# Patient Record
Sex: Female | Born: 1935 | Race: White | Hispanic: No | Marital: Single | Smoking: Never smoker
Health system: Southern US, Community
[De-identification: ages and names within clinical notes are randomized; demographics above are authoritative.]

## PROBLEM LIST (undated history)

## (undated) DIAGNOSIS — H40039 Anatomical narrow angle, unspecified eye: Secondary | ICD-10-CM

## (undated) DIAGNOSIS — M069 Rheumatoid arthritis, unspecified: Secondary | ICD-10-CM

## (undated) DIAGNOSIS — I6350 Cerebral infarction due to unspecified occlusion or stenosis of unspecified cerebral artery: Secondary | ICD-10-CM

## (undated) DIAGNOSIS — B001 Herpesviral vesicular dermatitis: Secondary | ICD-10-CM

## (undated) DIAGNOSIS — R7302 Impaired glucose tolerance (oral): Secondary | ICD-10-CM

## (undated) DIAGNOSIS — I4891 Unspecified atrial fibrillation: Secondary | ICD-10-CM

## (undated) DIAGNOSIS — E049 Nontoxic goiter, unspecified: Secondary | ICD-10-CM

## (undated) DIAGNOSIS — M3500 Sicca syndrome, unspecified: Secondary | ICD-10-CM

## (undated) DIAGNOSIS — I10 Essential (primary) hypertension: Secondary | ICD-10-CM

## (undated) DIAGNOSIS — E039 Hypothyroidism, unspecified: Secondary | ICD-10-CM

## (undated) DIAGNOSIS — M6740 Ganglion, unspecified site: Secondary | ICD-10-CM

## (undated) DIAGNOSIS — H0260 Xanthelasma of unspecified eye, unspecified eyelid: Secondary | ICD-10-CM

## (undated) HISTORY — DX: Ganglion, unspecified site: M67.40

## (undated) HISTORY — DX: Herpesviral vesicular dermatitis: B00.1

## (undated) HISTORY — DX: Hypothyroidism, unspecified: E03.9

## (undated) HISTORY — DX: Rheumatoid arthritis, unspecified: M06.9

## (undated) HISTORY — DX: Xanthelasma of unspecified eye, unspecified eyelid: H02.60

## (undated) HISTORY — DX: Cerebral infarction due to unspecified occlusion or stenosis of unspecified cerebral artery: I63.50

## (undated) HISTORY — DX: Anatomical narrow angle, unspecified eye: H40.039

## (undated) HISTORY — DX: Sjogren syndrome, unspecified: M35.00

## (undated) HISTORY — DX: Essential (primary) hypertension: I10

## (undated) HISTORY — DX: Nontoxic goiter, unspecified: E04.9

## (undated) HISTORY — DX: Impaired glucose tolerance (oral): R73.02

## (undated) HISTORY — PX: APPENDECTOMY: SHX54

## (undated) HISTORY — DX: Unspecified atrial fibrillation: I48.91

---

## 1996-03-12 HISTORY — PX: OTHER SURGICAL HISTORY: SHX169

## 2002-10-29 ENCOUNTER — Emergency Department (HOSPITAL_COMMUNITY): Admission: EM | Admit: 2002-10-29 | Discharge: 2002-10-29 | Payer: Self-pay | Admitting: Emergency Medicine

## 2004-01-31 ENCOUNTER — Ambulatory Visit: Payer: Self-pay | Admitting: Internal Medicine

## 2004-02-28 ENCOUNTER — Ambulatory Visit: Payer: Self-pay | Admitting: Cardiovascular Disease

## 2004-03-28 ENCOUNTER — Ambulatory Visit: Payer: Self-pay | Admitting: Internal Medicine

## 2004-04-04 ENCOUNTER — Ambulatory Visit: Payer: Self-pay | Admitting: Cardiology

## 2004-04-24 ENCOUNTER — Ambulatory Visit: Payer: Self-pay | Admitting: Cardiology

## 2004-05-22 ENCOUNTER — Ambulatory Visit: Payer: Self-pay | Admitting: Cardiology

## 2004-06-19 ENCOUNTER — Ambulatory Visit: Payer: Self-pay | Admitting: Cardiology

## 2004-07-17 ENCOUNTER — Ambulatory Visit: Payer: Self-pay | Admitting: *Deleted

## 2004-08-15 ENCOUNTER — Ambulatory Visit: Payer: Self-pay | Admitting: *Deleted

## 2004-09-13 ENCOUNTER — Ambulatory Visit: Payer: Self-pay | Admitting: Internal Medicine

## 2004-09-27 ENCOUNTER — Ambulatory Visit: Payer: Self-pay | Admitting: Cardiology

## 2004-10-25 ENCOUNTER — Ambulatory Visit: Payer: Self-pay | Admitting: Cardiology

## 2004-11-03 ENCOUNTER — Ambulatory Visit: Payer: Self-pay | Admitting: Internal Medicine

## 2004-11-23 ENCOUNTER — Ambulatory Visit: Payer: Self-pay | Admitting: Internal Medicine

## 2004-12-07 ENCOUNTER — Ambulatory Visit: Payer: Self-pay | Admitting: Cardiology

## 2004-12-21 ENCOUNTER — Ambulatory Visit: Payer: Self-pay | Admitting: Cardiology

## 2005-01-05 ENCOUNTER — Ambulatory Visit: Payer: Self-pay | Admitting: Internal Medicine

## 2005-01-11 ENCOUNTER — Ambulatory Visit: Payer: Self-pay | Admitting: Cardiology

## 2005-02-08 ENCOUNTER — Ambulatory Visit: Payer: Self-pay | Admitting: Cardiology

## 2005-03-16 ENCOUNTER — Ambulatory Visit: Payer: Self-pay | Admitting: Cardiology

## 2005-04-06 ENCOUNTER — Ambulatory Visit: Payer: Self-pay | Admitting: Cardiology

## 2005-04-26 ENCOUNTER — Ambulatory Visit: Payer: Self-pay | Admitting: Cardiology

## 2005-05-24 ENCOUNTER — Ambulatory Visit: Payer: Self-pay | Admitting: Cardiology

## 2005-06-21 ENCOUNTER — Ambulatory Visit: Payer: Self-pay | Admitting: Cardiology

## 2005-07-19 ENCOUNTER — Ambulatory Visit: Payer: Self-pay | Admitting: Cardiology

## 2005-08-16 ENCOUNTER — Ambulatory Visit: Payer: Self-pay | Admitting: Internal Medicine

## 2005-09-25 ENCOUNTER — Ambulatory Visit: Payer: Self-pay | Admitting: Cardiology

## 2005-10-05 ENCOUNTER — Ambulatory Visit: Payer: Self-pay | Admitting: Cardiology

## 2005-10-25 ENCOUNTER — Ambulatory Visit: Payer: Self-pay | Admitting: Cardiology

## 2005-11-22 ENCOUNTER — Ambulatory Visit: Payer: Self-pay | Admitting: Cardiology

## 2005-12-20 ENCOUNTER — Ambulatory Visit: Payer: Self-pay | Admitting: Cardiology

## 2006-01-03 ENCOUNTER — Ambulatory Visit: Payer: Self-pay | Admitting: Internal Medicine

## 2006-01-17 ENCOUNTER — Ambulatory Visit: Payer: Self-pay | Admitting: Internal Medicine

## 2006-02-14 ENCOUNTER — Ambulatory Visit: Payer: Self-pay | Admitting: Cardiovascular Disease

## 2006-03-07 ENCOUNTER — Ambulatory Visit: Payer: Self-pay | Admitting: Cardiology

## 2006-03-18 ENCOUNTER — Ambulatory Visit: Payer: Self-pay | Admitting: Cardiology

## 2006-04-09 ENCOUNTER — Ambulatory Visit: Payer: Self-pay | Admitting: Cardiology

## 2006-05-07 ENCOUNTER — Ambulatory Visit: Payer: Self-pay | Admitting: Cardiology

## 2006-06-04 ENCOUNTER — Ambulatory Visit: Payer: Self-pay | Admitting: Cardiology

## 2006-07-02 ENCOUNTER — Ambulatory Visit: Payer: Self-pay | Admitting: Cardiology

## 2006-07-30 ENCOUNTER — Ambulatory Visit: Payer: Self-pay | Admitting: Cardiology

## 2006-08-27 ENCOUNTER — Ambulatory Visit: Payer: Self-pay | Admitting: Cardiology

## 2006-09-24 ENCOUNTER — Ambulatory Visit: Payer: Self-pay | Admitting: Cardiovascular Disease

## 2006-10-15 ENCOUNTER — Ambulatory Visit: Payer: Self-pay | Admitting: Cardiology

## 2006-11-06 ENCOUNTER — Ambulatory Visit: Payer: Self-pay | Admitting: Internal Medicine

## 2006-11-06 ENCOUNTER — Ambulatory Visit: Payer: Self-pay | Admitting: Cardiology

## 2006-11-06 LAB — CONVERTED CEMR LAB
BUN: 12 mg/dL (ref 6–23)
CO2: 33 meq/L — ABNORMAL HIGH (ref 19–32)
Calcium: 9 mg/dL (ref 8.4–10.5)
Chloride: 103 meq/L (ref 96–112)
Creatinine, Ser: 0.6 mg/dL (ref 0.4–1.2)
GFR calc Af Amer: 122 mL/min
GFR calc non Af Amer: 101 mL/min
Glucose, Bld: 173 mg/dL — ABNORMAL HIGH (ref 70–99)
Potassium: 4.8 meq/L (ref 3.5–5.1)
Sodium: 142 meq/L (ref 135–145)

## 2006-11-08 ENCOUNTER — Ambulatory Visit: Payer: Self-pay | Admitting: Internal Medicine

## 2006-11-11 ENCOUNTER — Encounter: Payer: Self-pay | Admitting: Internal Medicine

## 2006-11-12 ENCOUNTER — Ambulatory Visit: Payer: Self-pay | Admitting: Internal Medicine

## 2006-11-12 ENCOUNTER — Ambulatory Visit: Payer: Self-pay | Admitting: Cardiology

## 2006-11-12 LAB — CONVERTED CEMR LAB
Basophils Absolute: 0 10*3/uL (ref 0.0–0.1)
Basophils Relative: 0.1 % (ref 0.0–1.0)
Eosinophils Absolute: 0.2 10*3/uL (ref 0.0–0.6)
Eosinophils Relative: 1.8 % (ref 0.0–5.0)
HCT: 42.4 % (ref 36.0–46.0)
Hemoglobin: 14.4 g/dL (ref 12.0–15.0)
INR: 5.3 — ABNORMAL HIGH (ref 0.8–1.0)
Lymphocytes Relative: 12.8 % (ref 12.0–46.0)
MCHC: 33.9 g/dL (ref 30.0–36.0)
MCV: 92.9 fL (ref 78.0–100.0)
Monocytes Absolute: 0.4 10*3/uL (ref 0.2–0.7)
Monocytes Relative: 3.1 % (ref 3.0–11.0)
Neutro Abs: 10.7 10*3/uL — ABNORMAL HIGH (ref 1.4–7.7)
Neutrophils Relative %: 82.2 % — ABNORMAL HIGH (ref 43.0–77.0)
Platelets: 492 10*3/uL — ABNORMAL HIGH (ref 150–400)
Prothrombin Time: 29.9 s — ABNORMAL HIGH (ref 10.9–13.3)
RBC: 4.57 M/uL (ref 3.87–5.11)
RDW: 14.5 % (ref 11.5–14.6)
WBC: 13 10*3/uL — ABNORMAL HIGH (ref 4.5–10.5)

## 2006-11-14 ENCOUNTER — Ambulatory Visit: Payer: Self-pay | Admitting: Internal Medicine

## 2006-11-15 ENCOUNTER — Ambulatory Visit: Payer: Self-pay | Admitting: Cardiology

## 2006-11-26 ENCOUNTER — Ambulatory Visit: Payer: Self-pay | Admitting: Cardiology

## 2006-12-10 ENCOUNTER — Ambulatory Visit: Payer: Self-pay | Admitting: Cardiology

## 2006-12-26 ENCOUNTER — Ambulatory Visit: Payer: Self-pay | Admitting: Cardiology

## 2007-01-04 ENCOUNTER — Ambulatory Visit: Payer: Self-pay | Admitting: Internal Medicine

## 2007-01-09 ENCOUNTER — Ambulatory Visit: Payer: Self-pay | Admitting: Internal Medicine

## 2007-01-28 ENCOUNTER — Encounter: Payer: Self-pay | Admitting: Internal Medicine

## 2007-02-04 ENCOUNTER — Ambulatory Visit: Payer: Self-pay | Admitting: Internal Medicine

## 2007-03-04 ENCOUNTER — Ambulatory Visit: Payer: Self-pay | Admitting: Cardiology

## 2007-03-24 ENCOUNTER — Encounter: Payer: Self-pay | Admitting: Internal Medicine

## 2007-03-25 ENCOUNTER — Ambulatory Visit: Payer: Self-pay | Admitting: Cardiovascular Disease

## 2007-04-01 ENCOUNTER — Ambulatory Visit: Payer: Self-pay | Admitting: Cardiovascular Disease

## 2007-04-14 ENCOUNTER — Encounter: Payer: Self-pay | Admitting: *Deleted

## 2007-04-14 DIAGNOSIS — I4891 Unspecified atrial fibrillation: Secondary | ICD-10-CM

## 2007-04-14 DIAGNOSIS — I10 Essential (primary) hypertension: Secondary | ICD-10-CM | POA: Insufficient documentation

## 2007-04-14 DIAGNOSIS — M3500 Sicca syndrome, unspecified: Secondary | ICD-10-CM | POA: Insufficient documentation

## 2007-04-14 DIAGNOSIS — M810 Age-related osteoporosis without current pathological fracture: Secondary | ICD-10-CM | POA: Insufficient documentation

## 2007-04-14 DIAGNOSIS — E049 Nontoxic goiter, unspecified: Secondary | ICD-10-CM | POA: Insufficient documentation

## 2007-04-14 DIAGNOSIS — M069 Rheumatoid arthritis, unspecified: Secondary | ICD-10-CM

## 2007-04-14 DIAGNOSIS — E039 Hypothyroidism, unspecified: Secondary | ICD-10-CM

## 2007-04-14 DIAGNOSIS — Z9089 Acquired absence of other organs: Secondary | ICD-10-CM

## 2007-04-16 ENCOUNTER — Ambulatory Visit: Payer: Self-pay | Admitting: Internal Medicine

## 2007-04-16 DIAGNOSIS — H40039 Anatomical narrow angle, unspecified eye: Secondary | ICD-10-CM

## 2007-04-29 ENCOUNTER — Ambulatory Visit: Payer: Self-pay | Admitting: Cardiology

## 2007-05-23 ENCOUNTER — Ambulatory Visit: Payer: Self-pay | Admitting: Internal Medicine

## 2007-05-23 DIAGNOSIS — J069 Acute upper respiratory infection, unspecified: Secondary | ICD-10-CM | POA: Insufficient documentation

## 2007-05-27 ENCOUNTER — Ambulatory Visit: Payer: Self-pay | Admitting: Cardiology

## 2007-06-24 ENCOUNTER — Ambulatory Visit: Payer: Self-pay | Admitting: Cardiology

## 2007-06-26 ENCOUNTER — Telehealth: Payer: Self-pay | Admitting: Internal Medicine

## 2007-07-23 ENCOUNTER — Ambulatory Visit: Payer: Self-pay | Admitting: Cardiology

## 2007-08-20 ENCOUNTER — Ambulatory Visit: Payer: Self-pay | Admitting: Cardiology

## 2007-09-17 ENCOUNTER — Ambulatory Visit: Payer: Self-pay | Admitting: Internal Medicine

## 2007-10-08 ENCOUNTER — Ambulatory Visit: Payer: Self-pay | Admitting: Cardiology

## 2007-11-05 ENCOUNTER — Ambulatory Visit: Payer: Self-pay | Admitting: Cardiovascular Disease

## 2007-11-19 ENCOUNTER — Ambulatory Visit: Payer: Self-pay | Admitting: Internal Medicine

## 2007-12-03 ENCOUNTER — Ambulatory Visit: Payer: Self-pay | Admitting: Cardiology

## 2007-12-05 ENCOUNTER — Ambulatory Visit: Payer: Self-pay | Admitting: Internal Medicine

## 2007-12-05 DIAGNOSIS — H0260 Xanthelasma of unspecified eye, unspecified eyelid: Secondary | ICD-10-CM | POA: Insufficient documentation

## 2007-12-05 DIAGNOSIS — B009 Herpesviral infection, unspecified: Secondary | ICD-10-CM | POA: Insufficient documentation

## 2007-12-05 DIAGNOSIS — M6740 Ganglion, unspecified site: Secondary | ICD-10-CM | POA: Insufficient documentation

## 2007-12-22 ENCOUNTER — Encounter: Payer: Self-pay | Admitting: Internal Medicine

## 2008-01-06 ENCOUNTER — Ambulatory Visit: Payer: Self-pay | Admitting: Cardiovascular Disease

## 2008-02-09 ENCOUNTER — Ambulatory Visit: Payer: Self-pay | Admitting: Cardiovascular Disease

## 2008-03-11 ENCOUNTER — Ambulatory Visit: Payer: Self-pay | Admitting: Cardiology

## 2008-03-11 ENCOUNTER — Ambulatory Visit: Payer: Self-pay | Admitting: Internal Medicine

## 2008-04-08 ENCOUNTER — Ambulatory Visit: Payer: Self-pay | Admitting: Internal Medicine

## 2008-04-19 ENCOUNTER — Encounter: Payer: Self-pay | Admitting: Internal Medicine

## 2008-05-06 ENCOUNTER — Ambulatory Visit: Payer: Self-pay | Admitting: Internal Medicine

## 2008-05-20 ENCOUNTER — Ambulatory Visit: Payer: Self-pay | Admitting: Cardiovascular Disease

## 2008-06-10 ENCOUNTER — Ambulatory Visit: Payer: Self-pay | Admitting: Cardiovascular Disease

## 2008-06-17 ENCOUNTER — Encounter: Payer: Self-pay | Admitting: Internal Medicine

## 2008-07-06 ENCOUNTER — Ambulatory Visit: Payer: Self-pay | Admitting: Cardiology

## 2008-08-03 ENCOUNTER — Ambulatory Visit: Payer: Self-pay | Admitting: Cardiology

## 2008-08-10 ENCOUNTER — Encounter: Payer: Self-pay | Admitting: *Deleted

## 2008-08-11 ENCOUNTER — Telehealth (INDEPENDENT_AMBULATORY_CARE_PROVIDER_SITE_OTHER): Payer: Self-pay | Admitting: *Deleted

## 2008-08-11 ENCOUNTER — Encounter: Payer: Self-pay | Admitting: Cardiology

## 2008-08-26 ENCOUNTER — Encounter: Payer: Self-pay | Admitting: Internal Medicine

## 2008-08-31 ENCOUNTER — Ambulatory Visit: Payer: Self-pay | Admitting: Cardiology

## 2008-08-31 LAB — CONVERTED CEMR LAB
POC INR: 2.3
Prothrombin Time: 18.7 s

## 2008-09-15 ENCOUNTER — Encounter: Payer: Self-pay | Admitting: *Deleted

## 2008-09-28 ENCOUNTER — Ambulatory Visit: Payer: Self-pay | Admitting: Cardiology

## 2008-09-28 LAB — CONVERTED CEMR LAB
POC INR: 1.8
Prothrombin Time: 16.4 s

## 2008-10-19 ENCOUNTER — Ambulatory Visit: Payer: Self-pay | Admitting: Internal Medicine

## 2008-10-19 LAB — CONVERTED CEMR LAB
POC INR: 2.4
Prothrombin Time: 19 s

## 2008-10-21 ENCOUNTER — Encounter: Payer: Self-pay | Admitting: Cardiology

## 2008-11-16 ENCOUNTER — Ambulatory Visit: Payer: Self-pay | Admitting: Cardiology

## 2008-11-16 LAB — CONVERTED CEMR LAB: POC INR: 2.6

## 2008-12-14 ENCOUNTER — Ambulatory Visit: Payer: Self-pay | Admitting: Cardiology

## 2008-12-14 LAB — CONVERTED CEMR LAB: POC INR: 2

## 2008-12-31 ENCOUNTER — Ambulatory Visit: Payer: Self-pay | Admitting: Internal Medicine

## 2008-12-31 LAB — CONVERTED CEMR LAB
ALT: 16 units/L (ref 0–35)
AST: 21 units/L (ref 0–37)
Albumin: 3.6 g/dL (ref 3.5–5.2)
Alkaline Phosphatase: 60 units/L (ref 39–117)
BUN: 17 mg/dL (ref 6–23)
Basophils Absolute: 0.1 10*3/uL (ref 0.0–0.1)
Basophils Relative: 1.1 % (ref 0.0–3.0)
Bilirubin, Direct: 0.1 mg/dL (ref 0.0–0.3)
CO2: 31 meq/L (ref 19–32)
Calcium: 8.9 mg/dL (ref 8.4–10.5)
Chloride: 101 meq/L (ref 96–112)
Creatinine, Ser: 0.6 mg/dL (ref 0.4–1.2)
Eosinophils Absolute: 0.2 10*3/uL (ref 0.0–0.7)
Eosinophils Relative: 2.6 % (ref 0.0–5.0)
GFR calc non Af Amer: 100.09 mL/min (ref >60)
Glucose, Bld: 136 mg/dL — ABNORMAL HIGH (ref 70–99)
HCT: 43.3 % (ref 36.0–46.0)
Hemoglobin: 14.6 g/dL (ref 12.0–15.0)
Lymphocytes Relative: 14.5 % (ref 12.0–46.0)
Lymphs Abs: 1.4 10*3/uL (ref 0.7–4.0)
MCHC: 33.8 g/dL (ref 30.0–36.0)
MCV: 96.4 fL (ref 78.0–100.0)
Monocytes Absolute: 0.8 10*3/uL (ref 0.1–1.0)
Monocytes Relative: 8.5 % (ref 3.0–12.0)
Neutro Abs: 6.9 10*3/uL (ref 1.4–7.7)
Neutrophils Relative %: 73.3 % (ref 43.0–77.0)
Platelets: 286 10*3/uL (ref 150.0–400.0)
Potassium: 4.2 meq/L (ref 3.5–5.1)
RBC: 4.49 M/uL (ref 3.87–5.11)
RDW: 15.8 % — ABNORMAL HIGH (ref 11.5–14.6)
Sodium: 139 meq/L (ref 135–145)
TSH: 11.79 microintl units/mL — ABNORMAL HIGH (ref 0.35–5.50)
Total Bilirubin: 0.7 mg/dL (ref 0.3–1.2)
Total Protein: 7.4 g/dL (ref 6.0–8.3)
WBC: 9.4 10*3/uL (ref 4.5–10.5)

## 2009-01-06 ENCOUNTER — Encounter: Payer: Self-pay | Admitting: Internal Medicine

## 2009-01-11 ENCOUNTER — Ambulatory Visit: Payer: Self-pay | Admitting: Internal Medicine

## 2009-01-11 LAB — CONVERTED CEMR LAB: POC INR: 2.3

## 2009-02-08 ENCOUNTER — Ambulatory Visit: Payer: Self-pay | Admitting: Cardiology

## 2009-02-08 LAB — CONVERTED CEMR LAB: POC INR: 2

## 2009-03-01 ENCOUNTER — Telehealth: Payer: Self-pay | Admitting: Internal Medicine

## 2009-03-08 ENCOUNTER — Ambulatory Visit: Payer: Self-pay | Admitting: Cardiology

## 2009-03-08 LAB — CONVERTED CEMR LAB: POC INR: 2.3

## 2009-03-15 ENCOUNTER — Ambulatory Visit: Payer: Self-pay | Admitting: Cardiology

## 2009-03-23 ENCOUNTER — Encounter: Payer: Self-pay | Admitting: Internal Medicine

## 2009-04-05 ENCOUNTER — Ambulatory Visit: Payer: Self-pay | Admitting: Cardiovascular Disease

## 2009-04-05 LAB — CONVERTED CEMR LAB: POC INR: 2.3

## 2009-05-03 ENCOUNTER — Ambulatory Visit: Payer: Self-pay | Admitting: Cardiology

## 2009-05-03 LAB — CONVERTED CEMR LAB: POC INR: 1.7

## 2009-05-18 ENCOUNTER — Ambulatory Visit: Payer: Self-pay | Admitting: Cardiovascular Disease

## 2009-05-18 LAB — CONVERTED CEMR LAB: POC INR: 2.2

## 2009-05-19 ENCOUNTER — Encounter: Payer: Self-pay | Admitting: Internal Medicine

## 2009-06-15 ENCOUNTER — Ambulatory Visit: Payer: Self-pay | Admitting: Cardiology

## 2009-06-15 LAB — CONVERTED CEMR LAB: POC INR: 2.3

## 2009-07-13 ENCOUNTER — Ambulatory Visit: Payer: Self-pay | Admitting: Cardiology

## 2009-07-13 LAB — CONVERTED CEMR LAB: POC INR: 2.4

## 2009-08-03 ENCOUNTER — Encounter: Payer: Self-pay | Admitting: Internal Medicine

## 2009-08-10 ENCOUNTER — Ambulatory Visit: Payer: Self-pay | Admitting: Cardiology

## 2009-08-10 LAB — CONVERTED CEMR LAB: POC INR: 2.5

## 2009-09-08 ENCOUNTER — Ambulatory Visit: Payer: Self-pay | Admitting: Cardiology

## 2009-09-08 LAB — CONVERTED CEMR LAB: POC INR: 2.7

## 2009-09-15 ENCOUNTER — Encounter: Payer: Self-pay | Admitting: Internal Medicine

## 2009-10-06 ENCOUNTER — Ambulatory Visit: Payer: Self-pay | Admitting: Internal Medicine

## 2009-10-06 LAB — CONVERTED CEMR LAB: POC INR: 2.4

## 2009-11-03 ENCOUNTER — Ambulatory Visit: Payer: Self-pay | Admitting: Internal Medicine

## 2009-11-03 LAB — CONVERTED CEMR LAB: POC INR: 3.3

## 2009-11-09 ENCOUNTER — Encounter: Payer: Self-pay | Admitting: Cardiology

## 2009-11-15 ENCOUNTER — Ambulatory Visit: Payer: Self-pay | Admitting: Cardiovascular Disease

## 2009-11-24 ENCOUNTER — Ambulatory Visit: Payer: Self-pay | Admitting: Cardiology

## 2009-11-24 LAB — CONVERTED CEMR LAB: POC INR: 2.2

## 2009-11-29 ENCOUNTER — Ambulatory Visit: Payer: Self-pay | Admitting: Internal Medicine

## 2009-12-15 ENCOUNTER — Ambulatory Visit: Payer: Self-pay | Admitting: Cardiology

## 2009-12-15 ENCOUNTER — Inpatient Hospital Stay (HOSPITAL_COMMUNITY): Admission: EM | Admit: 2009-12-15 | Discharge: 2009-12-16 | Payer: Self-pay | Admitting: Emergency Medicine

## 2009-12-15 ENCOUNTER — Ambulatory Visit: Payer: Self-pay | Admitting: Internal Medicine

## 2009-12-16 ENCOUNTER — Encounter: Payer: Self-pay | Admitting: Internal Medicine

## 2009-12-19 ENCOUNTER — Telehealth: Payer: Self-pay | Admitting: Internal Medicine

## 2009-12-21 ENCOUNTER — Encounter: Payer: Self-pay | Admitting: Cardiology

## 2009-12-21 LAB — CONVERTED CEMR LAB
POC INR: 2.37
Prothrombin Time: 26 s

## 2009-12-28 ENCOUNTER — Telehealth: Payer: Self-pay | Admitting: Internal Medicine

## 2009-12-29 ENCOUNTER — Ambulatory Visit: Payer: Self-pay | Admitting: Internal Medicine

## 2009-12-29 DIAGNOSIS — I6350 Cerebral infarction due to unspecified occlusion or stenosis of unspecified cerebral artery: Secondary | ICD-10-CM | POA: Insufficient documentation

## 2010-01-04 ENCOUNTER — Encounter: Payer: Self-pay | Admitting: Internal Medicine

## 2010-01-05 ENCOUNTER — Ambulatory Visit: Payer: Self-pay | Admitting: Internal Medicine

## 2010-01-05 ENCOUNTER — Telehealth: Payer: Self-pay | Admitting: Internal Medicine

## 2010-01-05 LAB — CONVERTED CEMR LAB: POC INR: 2.5

## 2010-01-07 ENCOUNTER — Encounter: Payer: Self-pay | Admitting: Internal Medicine

## 2010-01-11 ENCOUNTER — Encounter: Payer: Self-pay | Admitting: Internal Medicine

## 2010-01-16 ENCOUNTER — Telehealth: Payer: Self-pay | Admitting: Internal Medicine

## 2010-01-27 ENCOUNTER — Ambulatory Visit: Payer: Self-pay | Admitting: Internal Medicine

## 2010-01-27 LAB — CONVERTED CEMR LAB: TSH: 2.11 microintl units/mL (ref 0.35–5.50)

## 2010-01-31 ENCOUNTER — Telehealth: Payer: Self-pay | Admitting: Internal Medicine

## 2010-02-04 ENCOUNTER — Telehealth: Payer: Self-pay | Admitting: Internal Medicine

## 2010-02-06 ENCOUNTER — Ambulatory Visit: Payer: Self-pay | Admitting: Cardiology

## 2010-02-06 LAB — CONVERTED CEMR LAB: POC INR: 2.6

## 2010-02-13 ENCOUNTER — Telehealth: Payer: Self-pay | Admitting: Internal Medicine

## 2010-03-09 ENCOUNTER — Ambulatory Visit: Payer: Self-pay | Admitting: Cardiology

## 2010-03-09 LAB — CONVERTED CEMR LAB: POC INR: 2.7

## 2010-03-16 ENCOUNTER — Ambulatory Visit: Admission: RE | Admit: 2010-03-16 | Discharge: 2010-03-16 | Payer: Self-pay | Source: Home / Self Care

## 2010-03-17 ENCOUNTER — Encounter: Payer: Self-pay | Admitting: Cardiology

## 2010-03-28 ENCOUNTER — Telehealth: Payer: Self-pay | Admitting: Internal Medicine

## 2010-03-29 ENCOUNTER — Ambulatory Visit
Admission: RE | Admit: 2010-03-29 | Discharge: 2010-03-29 | Payer: Self-pay | Source: Home / Self Care | Attending: Internal Medicine | Admitting: Internal Medicine

## 2010-03-30 ENCOUNTER — Ambulatory Visit
Admission: RE | Admit: 2010-03-30 | Discharge: 2010-03-30 | Payer: Self-pay | Source: Home / Self Care | Attending: Internal Medicine | Admitting: Internal Medicine

## 2010-03-31 ENCOUNTER — Ambulatory Visit
Admission: RE | Admit: 2010-03-31 | Discharge: 2010-03-31 | Payer: Self-pay | Source: Home / Self Care | Attending: Internal Medicine | Admitting: Internal Medicine

## 2010-04-04 ENCOUNTER — Ambulatory Visit
Admission: RE | Admit: 2010-04-04 | Discharge: 2010-04-04 | Payer: Self-pay | Source: Home / Self Care | Attending: Internal Medicine | Admitting: Internal Medicine

## 2010-04-06 ENCOUNTER — Ambulatory Visit: Admission: RE | Admit: 2010-04-06 | Discharge: 2010-04-06 | Payer: Self-pay | Source: Home / Self Care

## 2010-04-06 LAB — CONVERTED CEMR LAB: POC INR: 3.4

## 2010-04-11 NOTE — Miscellaneous (Signed)
Summary: Living Will  Living Will   Imported By: Sherian Rein 01/04/2010 08:21:18  _____________________________________________________________________  External Attachment:    Type:   Image     Comment:   External Document

## 2010-04-11 NOTE — Medication Information (Signed)
Summary: rov/jk  Anticoagulant Therapy  Managed by: Bethena Midget, RN, BSN Referring MD: Rollene Rotunda MD PCP: Jacques Navy MD Supervising MD: Eden Emms MD, Theron Arista Indication 1: Atrial Fibrillation (ICD-427.31) Lab Used: [facility] Site: [facility] INR POC 1.2 INR RANGE 2 - 3  Dietary changes: no    Health status changes: no    Bleeding/hemorrhagic complications: no    Recent/future hospitalizations: no    Any changes in medication regimen? yes       Details: Holding Methotrexate for extraction.   Recent/future dental: yes     Details: Extraction on 11/16/09  Any missed doses?: yes     Details: Took last dose on Friday for extraction per Dr Bradly Bienenstock  Is patient compliant with meds? yes       Allergies: 1)  ! Plaquenil (Hydroxychloroquine Sulfate)  Anticoagulation Management History:      The patient is taking warfarin and comes in today for a routine follow up visit.  Positive risk factors for bleeding include an age of 75 years or older.  The bleeding index is 'intermediate risk'.  Positive CHADS2 values include History of HTN and Age > 55 years old.  The start date was 09/22/2002.  Her last INR was 5.3 RATIO.  Anticoagulation responsible Alban Marucci: Eden Emms MD, Theron Arista.  INR POC: 1.2.  Cuvette Lot#: [id number].  Exp: 12/11/2010.    Anticoagulation Management Assessment/Plan:      The patient's current anticoagulation dose is Jantoven 5 mg  tabs: as directed.  The target INR is 2.0-3.0.  The next INR is due 11/24/2009.  Anticoagulation instructions were given to patient.  Results were reviewed/authorized by Bethena Midget, RN, BSN.  She was notified by Bethena Midget, RN, BSN.         Prior Anticoagulation Instructions: INR 3.3  Skip today, August 25th's dose of Coumadin.  Then resume normal schedule of taking 1/2 tablet (2.5mg ) every day except take 1 tablet (5mg ) on Tuesdays.  Recheck in 3 weeks.   Current Anticoagulation Instructions: INR 1.2 Resume per Dr Bradly Bienenstock  instructions. Restart with 5mg s for 2 days then resume 2.5mg s everyday except 5mg s on Tuesdays. Recheck in one week.

## 2010-04-11 NOTE — Medication Information (Signed)
Summary: [name] [name]  Anticoagulant Therapy  Managed by: Bethena Midget, RN, BSN Referring MD: Rollene Rotunda MD PCP: Jacques Navy MD Supervising MD: Shirlee Latch MD, Dalton Indication 1: Atrial Fibrillation (ICD-427.31) Lab Used: [facility] Site: [facility] INR POC 2.2 INR RANGE 2 - 3  Dietary changes: no    Health status changes: no    Bleeding/hemorrhagic complications: no    Recent/future hospitalizations: no    Any changes in medication regimen? yes       Details: Took Amoxicillin post extractions for 5 days  Recent/future dental: no  Any missed doses?: no       Is patient compliant with meds? yes       Allergies: 1)  ! Plaquenil (Hydroxychloroquine Sulfate)  Anticoagulation Management History:      The patient is taking warfarin and comes in today for a routine follow up visit.  Positive risk factors for bleeding include an age of 75 years or older.  The bleeding index is 'intermediate risk'.  Positive CHADS2 values include History of HTN and Age > 71 years old.  The start date was 09/22/2002.  Her last INR was 5.3 RATIO.  Anticoagulation responsible Pauline Trainer: Shirlee Latch MD, Dalton.  INR POC: 2.2.  Cuvette Lot#: [id number].  Exp: 12/11/2010.    Anticoagulation Management Assessment/Plan:      The patient's current anticoagulation dose is Jantoven 5 mg  tabs: as directed.  The target INR is 2.0-3.0.  The next INR is due 12/22/2009.  Anticoagulation instructions were given to patient.  Results were reviewed/authorized by Bethena Midget, RN, BSN.  She was notified by Bethena Midget, RN, BSN.         Prior Anticoagulation Instructions: INR 1.2 Resume per Dr Bradly Bienenstock instructions. Restart with 5mg s for 2 days then resume 2.5mg s everyday except 5mg s on Tuesdays. Recheck in one week.  Current Anticoagulation Instructions: INR 2.2 Continue 2.5mg s everyday except 5mg s on Tuesdays. Recheck in 4 weeks

## 2010-04-11 NOTE — Medication Information (Signed)
Summary: rov/tm  Anticoagulant Therapy  Managed by: Bethena Midget, RN, BSN Referring MD: Rollene Rotunda MD PCP: Jacques Navy MD Supervising MD: Eden Emms MD, Theron Arista Indication 1: Atrial Fibrillation (ICD-427.31) Lab Used: [facility] Site: [facility] INR POC 2.2 INR RANGE 2 - 3  Dietary changes: no    Health status changes: no    Bleeding/hemorrhagic complications: no    Recent/future hospitalizations: no    Any changes in medication regimen? no    Recent/future dental: no  Any missed doses?: no       Is patient compliant with meds? yes       Allergies: 1)  ! Plaquenil (Hydroxychloroquine Sulfate)  Anticoagulation Management History:      The patient is taking warfarin and comes in today for a routine follow up visit.  Positive risk factors for bleeding include an age of 75 years or older.  The bleeding index is 'intermediate risk'.  Positive CHADS2 values include History of HTN and Age > 61 years old.  The start date was 09/22/2002.  Her last INR was 5.3 RATIO.  Anticoagulation responsible provider: Eden Emms MD, Theron Arista.  INR POC: 2.2.  Cuvette Lot#: [id number].  Exp: 07/2010.    Anticoagulation Management Assessment/Plan:      The patient's current anticoagulation dose is Jantoven 5 mg  tabs: as directed.  The target INR is 2.0-3.0.  The next INR is due 06/15/2009.  Anticoagulation instructions were given to patient.  Results were reviewed/authorized by Bethena Midget, RN, BSN.  She was notified by Bethena Midget, RN, BSN.         Prior Anticoagulation Instructions: INR 1.7 Today take 7.5mg s then resume 2.5mg s everyday except 5mg s on Tuesdays. Recheck in 2 weeks.   Current Anticoagulation Instructions: INR 2.2 Continue 2.5mg s daily except 5mg s on Tuesdays. Recheck in 4 weeks.

## 2010-04-11 NOTE — Letter (Signed)
Summary: [facility]   Imported By: Sherian Rein 05/31/2009 14:59:08  _____________________________________________________________________  External Attachment:    Type:   Image     Comment:   External Document

## 2010-04-11 NOTE — Letter (Signed)
Summary: Technical sales engineer  [facility]   Imported By: Lennie Odor 09/27/2009 14:11:27  _____________________________________________________________________  External Attachment:    Type:   Image     Comment:   External Document

## 2010-04-11 NOTE — Medication Information (Signed)
Summary: [facility]  Anticoagulant Therapy  Managed by: Bethena Midget, RN, BSN Referring MD: Rollene Rotunda MD PCP: Jacques Navy MD Supervising MD: Daleen Squibb MD, Maisie Fus Indication 1: Atrial Fibrillation (ICD-427.31) Lab Used: [facility] Site: [facility] PT 26.0 INR POC 2.37 INR RANGE 2 - 3  Dietary changes: no    Health status changes: no    Bleeding/hemorrhagic complications: no    Recent/future hospitalizations: yes       Details: Last Thursday AM didn't feel right, handwriting off, speech slurred, went to ER  Any changes in medication regimen? no    Recent/future dental: no  Any missed doses?: no       Is patient compliant with meds? yes      Comments: Lab from 12/16/09 at D/C from [facility]. Daughter (Ms Audie Pinto) called states pt went to ER and admitted for one day due to Dx of CVA. Wanted to know since INR was done there if we could CX appt for tomorrow and schedule out.  Instructed to continue same regimen and follow up in 2 weeks due to recent CVA.   Allergies: 1)  ! Plaquenil (Hydroxychloroquine Sulfate)  Anticoagulation Management History:      Her anticoagulation is being managed by telephone today.  Positive risk factors for bleeding include an age of 75 years or older.  The bleeding index is 'intermediate risk'.  Positive CHADS2 values include History of HTN and Age > 39 years old.  The start date was 09/22/2002.  Her last INR was 5.3 RATIO.  Prothrombin time is 26.0.  Anticoagulation responsible provider: Daleen Squibb MD, Maisie Fus.  INR POC: 2.37.    Anticoagulation Management Assessment/Plan:      The patient's current anticoagulation dose is Jantoven 5 mg  tabs: as directed.  The target INR is 2.0-3.0.  The next INR is due 01/05/2010.  Anticoagulation instructions were given to daughter.  Results were reviewed/authorized by Bethena Midget, RN, BSN.  She was notified by Bethena Midget, RN, BSN.         Prior Anticoagulation Instructions: INR 2.2 Continue 2.5mg s everyday except  5mg s on Tuesdays. Recheck in 4 weeks   Current Anticoagulation Instructions: INR 2.37 Continue 2.5mg s daily except 5mg s on Tuesdays. Recheck in 2 weeks.

## 2010-04-11 NOTE — Progress Notes (Signed)
Summary: REQ FOR ORDERS  Phone Note Call from Patient Call back at Home Phone [phone]   Caller: Patient Call For: Jacques Navy MD Summary of Call: [name] [name]/physical therapist needs verbal order to continue her pt through next week, [phone] Initial call taken by: Verdell Face,  December 28, 2009 11:12 AM  Follow-up for Phone Call        ok to continue PT Follow-up by: Jacques Navy MD,  December 28, 2009 11:14 AM  Additional Follow-up for Phone Call Additional follow up Details #1::        Therapist informed  Additional Follow-up by: Lamar Sprinkles, CMA,  December 28, 2009 11:53 AM

## 2010-04-11 NOTE — Medication Information (Signed)
Summary: rov/sp  Anticoagulant Therapy  Managed by: Weston Brass, PharmD Referring MD: Rollene Rotunda MD PCP: Jacques Navy MD Supervising MD: Riley Kill MD, Maisie Fus Indication 1: Atrial Fibrillation (ICD-427.31) Indication 2: CVA Lab Used: [facility] Site: [facility] INR POC 2.6 INR RANGE 2 - 3  Dietary changes: no    Health status changes: no    Bleeding/hemorrhagic complications: no    Recent/future hospitalizations: no    Any changes in medication regimen? yes       Details: Prednisone increased to 5 mg on 10/26; Synthroid was decresed to 75 mcg about 1 mo ago, 11/10 started on Amoxicillin x10d   Recent/future dental: yes     Details: 11/09 has a broken, was started on Amoxicillin 11/10 x10d  Any missed doses?: no       Is patient compliant with meds? yes       Current Medications (verified): 1)  Calcium Carbonate-Vitamin D 600-125 Mg-Unit  Tabs (Calcium Carbonate-Vitamin D) .... Two Daily 2)  Folic Acid 1 Mg  Tabs (Folic Acid) .... Once Daily 3)  Jantoven 5 Mg  Tabs (Warfarin Sodium) .... As Directed 4)  Levothroid 75 Mcg Tabs (Levothyroxine Sodium) .Marland Kitchen.. 1 Tablet Once A Day 5)  Diltiazem Hcl Cr 240 Mg  Cp24 (Diltiazem Hcl) .... Once Daily 6)  Digoxin 0.125 Mg  Tabs (Digoxin) .... One Half Tablet Daily 7)  Methotrexate 2.5 Mg  Tabs (Methotrexate Sodium) .... 6 Tablets Weekly 8)  Prednisone 5 Mg Tabs (Prednisone) .... Take 1 Tablet By Mouth Daily  Allergies: 1)  ! Plaquenil (Hydroxychloroquine Sulfate)  Anticoagulation Management History:      The patient is taking warfarin and comes in today for a routine follow up visit.  Positive risk factors for bleeding include an age of 4 years or older and history of CVA/TIA.  The bleeding index is 'intermediate risk'.  Positive CHADS2 values include History of HTN, Age > 66 years old, and Prior Stroke/CVA/TIA.  The start date was 09/22/2002.  Her last INR was 5.3 RATIO.  Anticoagulation responsible Luisfelipe Engelstad: Riley Kill MD,  Maisie Fus.  INR POC: 2.6.  Cuvette Lot#: [id number].  Exp: 02/2011.    Anticoagulation Management Assessment/Plan:      The patient's current anticoagulation dose is Jantoven 5 mg  tabs: as directed.  The target INR is 2.0-3.0.  The next INR is due 03/09/2010.  Anticoagulation instructions were given to daughter.  Results were reviewed/authorized by Weston Brass, PharmD.  She was notified by Hoy Register, PharmD Candidate.         Prior Anticoagulation Instructions: INR 2.5  Continue same dose of 1/2 tablet every day except 1 tablet on Tuesday.  Recheck INR in 4 weeks.   Current Anticoagulation Instructions: INR 2.6 Continue previous dose of 0.5 tablet everyday except 1 tablet on Tuesday Recheck INR in 4 weeks

## 2010-04-11 NOTE — Progress Notes (Signed)
Summary: warfarin  Phone Note Refill Request   Refills Requested: Medication #1:  JANTOVEN 5 MG  TABS as directed   Last Refilled: 01/03/2010 Received fax request for Warfarin Sodium 5 mg tab  Initial call taken by: Alysia Penna,  January 16, 2010 3:34 PM    Prescriptions: JANTOVEN 5 MG  TABS (WARFARIN SODIUM) as directed  #30 Tablet x 2   Entered by:   Alysia Penna   Authorized by:   Jacques Navy MD   Signed by:   Alysia Penna on 01/16/2010   Method used:   Electronically to        [facility] [phone]* (retail)       [address]       [place], [place]  [address]       Ph: [phone] or [phone]       Fax: [phone]   RxID:   [id number]

## 2010-04-11 NOTE — Medication Information (Signed)
Summary: rov/ewj  Anticoagulant Therapy  Managed by: Charolotte Eke, PharmD Referring MD: Rollene Rotunda MD PCP: Jacques Navy MD Supervising MD: Gala Romney MD, Reuel Boom Indication 1: Atrial Fibrillation (ICD-427.31) Lab Used: [facility] Site: [facility] INR POC 2.4 INR RANGE 2 - 3  Dietary changes: no    Health status changes: no    Bleeding/hemorrhagic complications: no    Recent/future hospitalizations: no    Any changes in medication regimen? no    Recent/future dental: no  Any missed doses?: no       Is patient compliant with meds? yes       Current Medications (verified): 1)  Calcium Carbonate-Vitamin D 600-125 Mg-Unit  Tabs (Calcium Carbonate-Vitamin D) .... Two Daily 2)  Folic Acid 1 Mg  Tabs (Folic Acid) .... Once Daily 3)  Alendronate Sodium 70 Mg  Tabs (Alendronate Sodium) .Marland Kitchen.. 1 Q Wk 4)  Jantoven 5 Mg  Tabs (Warfarin Sodium) .... As Directed 5)  Levothyroxine Sodium 125 Mcg Tabs (Levothyroxine Sodium) .Marland Kitchen.. 1 Once Daily 6)  Diltiazem Hcl Cr 240 Mg  Cp24 (Diltiazem Hcl) .... Once Daily 7)  Digoxin 0.125 Mg  Tabs (Digoxin) .... One Half Tablet Daily 8)  Methotrexate 2.5 Mg  Tabs (Methotrexate Sodium) .... 6 Tablets Weekly 9)  Prednisone 1 Mg Tabs (Prednisone) .... 3 Tabs By Mouth Daily  Allergies (verified): 1)  ! Plaquenil (Hydroxychloroquine Sulfate)  Anticoagulation Management History:      The patient is taking warfarin and comes in today for a routine follow up visit.  Positive risk factors for bleeding include an age of 75 years or older.  The bleeding index is 'intermediate risk'.  Positive CHADS2 values include History of HTN and Age > 15 years old.  The start date was 09/22/2002.  Her last INR was 5.3 RATIO.  Anticoagulation responsible provider: Bensimhon MD, Reuel Boom.  INR POC: 2.4.  Cuvette Lot#: [id number].  Exp: 12/11/2010.    Anticoagulation Management Assessment/Plan:      The patient's current anticoagulation dose is Jantoven 5 mg  tabs: as  directed.  The target INR is 2.0-3.0.  The next INR is due 11/03/2009.  Anticoagulation instructions were given to patient.  Results were reviewed/authorized by Charolotte Eke, PharmD.  She was notified by Charolotte Eke, PharmD.         Prior Anticoagulation Instructions: INR 2.7  Continue on same dosage 2.5mg  daily except 5mg  on Tuesdays.  Recheck in 4 weeks.    Current Anticoagulation Instructions: Continue same: 2.5mg  daily except 5mg  on Tuesdays.

## 2010-04-11 NOTE — Progress Notes (Signed)
  Phone Note Outgoing Call   Reason for Call: Discuss lab or test results Summary of Call: HEY!!!!!  Please call patient - TSH is 2.11 and normal. Continue present meds.  Thanks Initial call taken by: Jacques Navy MD,  February 04, 2010 5:47 PM  Follow-up for Phone Call        informed pt's daughter Follow-up by: Ami Bullins CMA,  February 06, 2010 4:37 PM

## 2010-04-11 NOTE — Medication Information (Signed)
Summary: rov/tm  Anticoagulant Therapy  Managed by: Cloyde Reams, RN, BSN Referring MD: Rollene Rotunda MD PCP: Jacques Navy MD Supervising MD: Shirlee Latch MD, Dalton Indication 1: Atrial Fibrillation (ICD-427.31) Lab Used: [facility] Site: [facility] INR POC 2.7 INR RANGE 2 - 3  Dietary changes: no    Health status changes: no    Bleeding/hemorrhagic complications: no    Recent/future hospitalizations: no    Any changes in medication regimen? yes       Details: Started on Prednisone 3mg  daily and Methotrexate 6 tablets weekly. Started on 08/05/09.   Recent/future dental: no  Any missed doses?: no       Is patient compliant with meds? yes       Allergies: 1)  ! Plaquenil (Hydroxychloroquine Sulfate)  Anticoagulation Management History:      The patient is taking warfarin and comes in today for a routine follow up visit.  Positive risk factors for bleeding include an age of 15 years or older.  The bleeding index is 'intermediate risk'.  Positive CHADS2 values include History of HTN and Age > 3 years old.  The start date was 09/22/2002.  Her last INR was 5.3 RATIO.  Anticoagulation responsible provider: Shirlee Latch MD, Dalton.  INR POC: 2.7.  Cuvette Lot#: [id number].  Exp: 11/2010.    Anticoagulation Management Assessment/Plan:      The patient's current anticoagulation dose is Jantoven 5 mg  tabs: as directed.  The target INR is 2.0-3.0.  The next INR is due 10/06/2009.  Anticoagulation instructions were given to patient.  Results were reviewed/authorized by Cloyde Reams, RN, BSN.  She was notified by Cloyde Reams RN.         Prior Anticoagulation Instructions: INR 2.5 Continue 2.5mg s daily except 5mg s on Tuesdays. Recheck in 4 weeks. Ok per Weston Brass, [place] D  Current Anticoagulation Instructions: INR 2.7  Continue on same dosage 2.5mg  daily except 5mg  on Tuesdays.  Recheck in 4 weeks.

## 2010-04-11 NOTE — Medication Information (Signed)
Summary: rov-tp  Anticoagulant Therapy  Managed by: Weston Brass, PharmD Referring MD: Rollene Rotunda MD PCP: Jacques Navy MD Supervising MD: Graciela Husbands MD, Viviann Spare Indication 1: Atrial Fibrillation (ICD-427.31) Lab Used: [facility] Site: [facility] INR POC 3.3 INR RANGE 2 - 3  Dietary changes: no    Health status changes: no    Bleeding/hemorrhagic complications: no    Recent/future hospitalizations: no    Any changes in medication regimen? no    Recent/future dental: yes     Details: Pt is having a tooth extraction in the near future with Dr. Corinna Gab [phone]).  Pending.    Any missed doses?: no       Is patient compliant with meds? yes       Allergies: 1)  ! Plaquenil (Hydroxychloroquine Sulfate)  Anticoagulation Management History:      The patient is taking warfarin and comes in today for a routine follow up visit.  Positive risk factors for bleeding include an age of 20 years or older.  The bleeding index is 'intermediate risk'.  Positive CHADS2 values include History of HTN and Age > 37 years old.  The start date was 09/22/2002.  Her last INR was 5.3 RATIO.  Anticoagulation responsible provider: Graciela Husbands MD, Viviann Spare.  INR POC: 3.3.  Cuvette Lot#: [id number].  Exp: 12/11/2010.    Anticoagulation Management Assessment/Plan:      The patient's current anticoagulation dose is Jantoven 5 mg  tabs: as directed.  The target INR is 2.0-3.0.  The next INR is due 11/24/2009.  Anticoagulation instructions were given to patient.  Results were reviewed/authorized by Weston Brass, PharmD.  She was notified by Gweneth Fritter, PharmD Candidate.         Prior Anticoagulation Instructions: Continue same: 2.5mg  daily except 5mg  on Tuesdays.  Current Anticoagulation Instructions: INR 3.3  Skip today, August 25th's dose of Coumadin.  Then resume normal schedule of taking 1/2 tablet (2.5mg ) every day except take 1 tablet (5mg ) on Tuesdays.  Recheck in 3 weeks.

## 2010-04-11 NOTE — Letter (Signed)
Summary: [facility]   Imported By: Sherian Rein 09/07/2009 13:52:06  _____________________________________________________________________  External Attachment:    Type:   Image     Comment:   External Document

## 2010-04-11 NOTE — Assessment & Plan Note (Signed)
Summary: post hospital/per sarah/ok time/cd   Vital Signs:  Patient profile:   77 year old female Height:      61 inches Weight:      130 pounds BMI:     24.65 O2 Sat:      96 % on Room air Temp:     97.3 degrees F oral Pulse rate:   76 / minute BP sitting:   152 / 68  (left arm) Cuff size:   regular  Vitals Entered By: Bill Salinas CMA (December 29, 2009 4:45 PM)  O2 Flow:  Room air CC: hosp follow up/ ab   Primary Care Provider:  Jacques Navy MD  CC:  hosp follow up/ ab.  History of Present Illness: Patient is seen post-hospital. She was admitted for symptoms of a CVA that were transient. MRI did reveal a pontine infarct left. She has done very well since being home with full recovery of her ability to write and her speech. She does have some gait abnormality and has been working with PT at home. She is using a walker and will be going to a cane. Her hospital records were reviewed with her and her daughter, including a review of the MRI images and report.   Current Medications (verified): 1)  Calcium Carbonate-Vitamin D 600-125 Mg-Unit  Tabs (Calcium Carbonate-Vitamin D) .... Two Daily 2)  Folic Acid 1 Mg  Tabs (Folic Acid) .... Once Daily 3)  Jantoven 5 Mg  Tabs (Warfarin Sodium) .... As Directed 4)  Levothyroxine Sodium 125 Mcg Tabs (Levothyroxine Sodium) .Marland Kitchen.. 1 Once Daily 5)  Diltiazem Hcl Cr 240 Mg  Cp24 (Diltiazem Hcl) .... Once Daily 6)  Digoxin 0.125 Mg  Tabs (Digoxin) .... One Half Tablet Daily 7)  Methotrexate 2.5 Mg  Tabs (Methotrexate Sodium) .... 6 Tablets Weekly 8)  Prednisone 1 Mg Tabs (Prednisone) .... 3 Tabs By Mouth Daily  Allergies (verified): 1)  ! 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 * VAGINAL CYST REMOVED 1998 APPENDECTOMY, HX OF (ICD-V45.79)    Family History: Reviewed history from 12/05/2007 and no changes required. non-contributory  Social History: Reviewed history from 11/29/2009 and no changes required. HSG, married '43-29 years, widowed. 1 daughter - '56-adopted; 1 grandson lives alone - I ADLs End- of - Life : still has packet at home, she has not made a decision (rediscussed 9/11)  Review of Systems       The patient complains of difficulty walking.  The patient denies anorexia, fever, weight loss, weight gain, decreased hearing, hoarseness, chest pain, syncope, dyspnea on exertion, prolonged cough, abdominal pain, severe indigestion/heartburn, muscle weakness, suspicious skin lesions, depression, unusual weight change, and enlarged lymph nodes.    Physical Exam  General:  WNWD white woman who looks great for her age Head:  Normocephalic and atraumatic without obvious abnormalities. No apparent alopecia or balding. Eyes:  vision grossly intact, pupils equal, pupils round, and corneas and lenses clear.   Neck:  supple and full ROM.   Lungs:  normal respiratory effort and normal breath sounds.   Heart:  normal rate, regular rhythm, no murmur, and no gallop.   Abdomen:  soft, non-tender, and normal bowel sounds.  Msk:  normal ROM, no joint tenderness, no joint swelling, and no redness over joints.   Pulses:  2+ radial Neurologic:  alert & oriented X3, cranial nerves II-XII intact, and strength normal in all extremities.  Gait favors the right leg. Skin:  turgor normal, color normal, and no suspicious lesions.   Psych:  Oriented X3, memory intact for recent and remote, normally interactive, and good eye contact.     Impression &  Recommendations:  Problem # 1:  CVA (ICD-434.91) Making a very good recovery. She is on coumadin and was at the time of her stroke.   Plan - finish home PT           continue coumadin.  Her updated medication list for this problem includes:    Jantoven 5 Mg Tabs (Warfarin sodium) .Marland Kitchen... As directed  Problem # 2:  HYPERTENSION (ICD-401.9)  Her updated medication list for this problem includes:    Diltiazem Hcl Cr 240 Mg Cp24 (Diltiazem hcl) ..... Once daily  BP today: 152/68 Prior BP: 120/56 (11/29/2009)  BP elevated at today's visit.  Plan - monitor BP at home. If she remains hypertensive will need to adjust medications.      Complete Medication List: 1)  Calcium Carbonate-vitamin D 600-125 Mg-unit Tabs (Calcium carbonate-vitamin d) .... Two daily 2)  Folic Acid 1 Mg Tabs (Folic acid) .... Once daily 3)  Jantoven 5 Mg Tabs (Warfarin sodium) .... As directed 4)  Levothyroxine Sodium 125 Mcg Tabs (Levothyroxine sodium) .Marland Kitchen.. 1 once daily 5)  Diltiazem Hcl Cr 240 Mg Cp24 (Diltiazem hcl) .... Once daily 6)  Digoxin 0.125 Mg Tabs (Digoxin) .... One half tablet daily 7)  Methotrexate 2.5 Mg Tabs (Methotrexate sodium) .... 6 tablets weekly 8)  Prednisone 1 Mg Tabs (Prednisone) .... 3 tabs by mouth daily   Orders Added: 1)  Est. Patient Level IV [29562]

## 2010-04-11 NOTE — Assessment & Plan Note (Signed)
Summary: 1 yr rov f/u 427.31   pfh   Visit Type:  Follow-up Primary Provider:  Jacques Navy MD  CC:  Atrial Fib.  History of Present Illness: The patient presents for yearly followup. Since I last saw her she has had no new cardiovascular problems. Rarely notices palpitations. She's had some mild problems with balance but no dizziness presyncope or syncope. She has had no falls. She still lives independently. She has no shortness of breath and denies any PND or orthopnea. She has no chest pressure, neck or arm discomfort. She is bothered by arthritis and is planning to start Humira.  Current Medications (verified): 1)  Calcium Carbonate-Vitamin D 600-125 Mg-Unit  Tabs (Calcium Carbonate-Vitamin D) .... Two Daily 2)  Folic Acid 1 Mg  Tabs (Folic Acid) .... Once Daily 3)  Alendronate Sodium 70 Mg  Tabs (Alendronate Sodium) .Marland Kitchen.. 1 Q Wk 4)  Jantoven 5 Mg  Tabs (Warfarin Sodium) .... As Directed 5)  Levothyroxine Sodium 125 Mcg Tabs (Levothyroxine Sodium) .Marland Kitchen.. 1 Once Daily 6)  Diltiazem Hcl Cr 240 Mg  Cp24 (Diltiazem Hcl) .... Once Daily 7)  Digoxin 0.125 Mg  Tabs (Digoxin) .... One Half Tablet Daily 8)  Methotrexate 2.5 Mg  Tabs (Methotrexate Sodium) .... 8 Tablets Weekly 9)  Prednisone 5 Mg Tabs (Prednisone) .Marland Kitchen.. 1 Tab By Mouth Once Daily  Allergies: 1)  ! Plaquenil (Hydroxychloroquine Sulfate)  Past History:  Past Medical History: Reviewed history from 04/14/2007 and no changes required. HYPOTHYROIDISM (ICD-244.9) HYPERTENSION (ICD-401.9) ATRIAL FIBRILLATION (ICD-427.31) SJOGREN'S SYNDROME (ICD-710.2) GOITER (ICD-240.9) ARTHRITIS, RHEUMATOID (ICD-714.0) OSTEOPOROSIS (ICD-733.00)  Past Surgical History: Reviewed history from 04/14/2007 and no changes required. * VAGINAL CYST REMOVED 1998 APPENDECTOMY, HX OF (ICD-V45.79)    Review of Systems       As stated in the HPI and negative for all other systems.   Vital Signs:  Patient profile:   77 year old female Height:       61 inches Weight:      132 pounds Pulse rate:   72 / minute Resp:     12 per minute BP sitting:   160 / 70  (left arm)  Vitals Entered By: Kem Parkinson (March 15, 2009 2:24 PM)  Physical Exam  General:  Well developed, well nourished, in no acute distress. Head:  normocephalic and atraumatic Eyes:  PERRLA/EOM intact; conjunctiva and lids normal. Mouth:  Teeth, gums and palate normal. Oral mucosa normal. Neck:  Neck supple, no JVD. No masses, thyromegaly or abnormal cervical nodes. Chest Wall:  no deformities or breast masses noted Lungs:  Clear bilaterally to auscultation and percussion. Abdomen:  Bowel sounds positive; abdomen soft and non-tender without masses, organomegaly, or hernias noted. No hepatosplenomegaly. Msk:  Back normal, normal gait. Muscle strength and tone normal. Extremities:  No clubbing or cyanosis. Neurologic:  Alert and oriented x 3. Skin:  Intact without lesions or rashes. Psych:  Normal affect.   Detailed Cardiovascular Exam  Neck    Carotids: Carotids full and equal bilaterally without bruits.      Neck Veins: Normal, no JVD.    Heart    Inspection: no deformities or lifts noted.      Palpation: normal PMI with no thrills palpable.      Auscultation: S1 and S2 within normal limits, no S3, no clicks, no rubs, no murmurs, irregular rhythm.    Vascular    Abdominal Aorta: no palpable masses, pulsations, or audible bruits.      Femoral Pulses:  normal femoral pulses bilaterally.      Pedal Pulses: normal pedal pulses bilaterally.      Radial Pulses: normal radial pulses bilaterally.      Peripheral Circulation: no clubbing, cyanosis, or edema noted with normal capillary refill.     EKG  Procedure date:  03/15/2009  Findings:      atrial fibrillation, rate 72, left axis deviation, poor anterior R-wave progression, no acute ST-T wave changes, nonspecific ST depression  Impression & Recommendations:  Problem # 1:  ATRIAL FIBRILLATION  (ICD-427.31) The patient has had no problems associated with this. We did review the possible drug interaction between Humira and Coumadin. This will be followed in our Coumadin clinic. She would otherwise have no contraindication to this. Orders: EKG w/ Interpretation (93000)  Problem # 2:  HYPERTENSION (ICD-401.9) Her blood pressure is slightly elevated today. However, she's had a busy stressful day. I reviewed the blood pressures from Dr. Debby Bud visits and these have been typically below 140/90. Therefore, I will make no change her medicines at this time.  Patient Instructions: 1)  Your physician recommends that you schedule a follow-up appointment in: 1 year with Dr Antoine Poche 2)  Your physician recommends that you continue on your current medications as directed. Please refer to the Current Medication list given to you today. 3)  You have been diagnosed with atrial fibrillation.  Atrial fibrillation is a condition in which one of the upper chambers of the heart has extra electrical cells causing it to beat very fast.  Please see the handout/brochure given to you today for further information.

## 2010-04-11 NOTE — Letter (Signed)
Summary: Prosth[facility]ics  Prosthodontics   Imported By: Marylou Mccoy 11/10/2009 10:19:51  _____________________________________________________________________  External Attachment:    Type:   Image     Comment:   External Document  Appended Document: Prosthodontics OK to stop coumadin.
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
## 2010-04-11 NOTE — Progress Notes (Signed)
  Phone Note Other Incoming   Caller: Spero Curb Summary of Call: Pt has been off coumadin for 2 days and her daughter is calling to see if it is ok for her to be off the coumadin for another 4 days, she is having a route canal. Her daughter said she has had this done before and been ok'd to be taken off (doing fine) coumadin by [facility]. Please Advise. Pt's daughter had called [facility] as well Initial call taken by: Ami Bullins CMA,  February 13, 2010 4:44 PM  Follow-up for Phone Call        OK to hold coumadin for up-coming root canal Follow-up by: Jacques Navy MD,  February 13, 2010 5:30 PM  Additional Follow-up for Phone Call Additional follow up Details #1::        when should pt start coumdin after procedure and she states pt is being put on amoxicillin 500mg  FYI Additional Follow-up by: Ami Bullins CMA,  February 14, 2010 10:33 AM    Additional Follow-up for Phone Call Additional follow up Details #2::    when dentist thinks her bleeding risk is over. Follow-up by: Jacques Navy MD,  February 14, 2010 5:44 PM  Additional Follow-up for Phone Call Additional follow up Details #3:: Details for Additional Follow-up Action Taken: informed pt's daughter rebecca  Additional Follow-up by: Ami Bullins CMA,  February 15, 2010 11:51 AM

## 2010-04-11 NOTE — Letter (Signed)
Summary: [facility]   Imported By: Sherian Rein 01/17/2010 10:49:24  _____________________________________________________________________  External Attachment:    Type:   Image     Comment:   External Document

## 2010-04-11 NOTE — Miscellaneous (Signed)
Summary: Health Care POA  Health Care POA   Imported By: Sherian Rein 01/04/2010 08:22:32  _____________________________________________________________________  External Attachment:    Type:   Image     Comment:   External Document

## 2010-04-11 NOTE — Medication Information (Signed)
Summary: rov/tm  Anticoagulant Therapy  Managed by: Weston Brass, PharmD Referring MD: Rollene Rotunda MD PCP: Jacques Navy MD Supervising MD: Gala Romney MD, Reuel Boom Indication 1: Atrial Fibrillation (ICD-427.31) Indication 2: CVA Lab Used: [facility]  Site: [facility] INR POC 2.5 INR RANGE 2 - 3  Dietary changes: no    Health status changes: no    Bleeding/hemorrhagic complications: no    Recent/future hospitalizations: yes       Details: went to hospital earlier this month with CVA.  INR was therapeutic.   Any changes in medication regimen? yes       Details: increased prednisone from 3mg  daily to 5mg  daily  Recent/future dental: no  Any missed doses?: no       Is patient compliant with meds? yes       Allergies: 1)  ! Plaquenil (Hydroxychloroquine Sulfate)  Anticoagulation Management History:      The patient is taking warfarin and comes in today for a routine follow up visit.  Positive risk factors for bleeding include an age of 75 years or older and history of CVA/TIA.  The bleeding index is 'intermediate risk'.  Positive CHADS2 values include History of HTN, Age > 22 years old, and Prior Stroke/CVA/TIA.  The start date was 09/22/2002.  Her last INR was 5.3 RATIO.  Anticoagulation responsible provider: Bensimhon MD, Reuel Boom.  INR POC: 2.5.  Cuvette Lot#: [id number].  Exp: 02/2011.    Anticoagulation Management Assessment/Plan:      The patient's current anticoagulation dose is Jantoven 5 mg  tabs: as directed.  The target INR is 2.0-3.0.  The next INR is due 02/06/2010.  Anticoagulation instructions were given to daughter.  Results were reviewed/authorized by Weston Brass, PharmD.  She was notified by Weston Brass PharmD.         Prior Anticoagulation Instructions: INR 2.37 Continue 2.5mg s daily except 5mg s on Tuesdays. Recheck in 2 weeks.   Current Anticoagulation Instructions: INR 2.5  Continue same dose of 1/2 tablet every day except 1 tablet on Tuesday.  Recheck INR  in 4 weeks.

## 2010-04-11 NOTE — Letter (Signed)
Summary: [facility]   Imported By: Lester Boykin 04/05/2009 15:57:55  _____________________________________________________________________  External Attachment:    Type:   Image     Comment:   External Document

## 2010-04-11 NOTE — Miscellaneous (Signed)
Summary: Order/[facility]  Order/[facility]   Imported By: Lester [place] 01/10/2010 09:37:37  _____________________________________________________________________  External Attachment:    Type:   Image     Comment:   External Document

## 2010-04-11 NOTE — Miscellaneous (Signed)
Summary: Care Plans/[facility]  Care Plans/[facility]   Imported By: Sherian Rein 01/12/2010 11:54:08  _____________________________________________________________________  External Attachment:    Type:   Image     Comment:   External Document

## 2010-04-11 NOTE — Miscellaneous (Signed)
Summary: PT orders/[facility]  PT orders/[facility]   Imported By: Sherian Rein 01/05/2010 13:09:46  _____________________________________________________________________  External Attachment:    Type:   Image     Comment:   External Document

## 2010-04-11 NOTE — Medication Information (Signed)
Summary: ROV/TM  Anticoagulant Therapy  Managed by: Elaina Pattee, PharmD Referring MD: Rollene Rotunda MD PCP: Jacques Navy MD Supervising MD: Riley Kill MD, Maisie Fus Indication 1: Atrial Fibrillation (ICD-427.31) Lab Used: [facility] Site: [facility] INR POC 2.3 INR RANGE 2 - 3  Dietary changes: no    Health status changes: no    Bleeding/hemorrhagic complications: no    Recent/future hospitalizations: no    Any changes in medication regimen? yes       Details: Tapering prednisone every 2 weeks. Please ask patient which dose she is on at next visit.  Recent/future dental: no  Any missed doses?: no       Is patient compliant with meds? yes       Allergies: 1)  ! Plaquenil (Hydroxychloroquine Sulfate)  Anticoagulation Management History:      The patient is taking warfarin and comes in today for a routine follow up visit.  Positive risk factors for bleeding include an age of 17 years or older.  The bleeding index is 'intermediate risk'.  Positive CHADS2 values include History of HTN and Age > 59 years old.  The start date was 09/22/2002.  Her last INR was 5.3 RATIO.  Anticoagulation responsible provider: Riley Kill MD, Maisie Fus.  INR POC: 2.3.  Cuvette Lot#: [id number].  Exp: 07/2010.    Anticoagulation Management Assessment/Plan:      The patient's current anticoagulation dose is Jantoven 5 mg  tabs: as directed.  The target INR is 2.0-3.0.  The next INR is due 07/13/2009.  Anticoagulation instructions were given to patient.  Results were reviewed/authorized by Elaina Pattee, PharmD.  She was notified by Elaina Pattee, PharmD.         Prior Anticoagulation Instructions: INR 2.2 Continue 2.5mg s daily except 5mg s on Tuesdays. Recheck in 4 weeks.   Current Anticoagulation Instructions: INR 2.3. Take 0.5 tablet daily except 1 tablet on Tuesdays.

## 2010-04-11 NOTE — Progress Notes (Signed)
Summary: RESULTS  Phone Note Call from Patient   Details for Reason: Call daughter on cell [phone] Summary of Call: Pt had recent TSH labs to f/u from new dose. Please advise so I can call daughter, should pt stay on same dose? Initial call taken by: Lamar Sprinkles, CMA,  January 31, 2010 9:01 AM  Follow-up for Phone Call        TSH 2.11 - perfect. Continue present dose.  Follow-up by: Jacques Navy MD,  January 31, 2010 6:00 PM  Additional Follow-up for Phone Call Additional follow up Details #1::        informed pts daughter rebecca  Additional Follow-up by: Ami Bullins CMA,  February 01, 2010 8:33 AM

## 2010-04-11 NOTE — Progress Notes (Signed)
Summary: Home Health   Phone Note From Other Clinic   Summary of Call: Physical therapist called req verbal ok for social worker consult to help family with insurance issues. I gave verbal ok.  Initial call taken by: Lamar Sprinkles, CMA,  December 19, 2009 5:07 PM  Follow-up for Phone Call        k Follow-up by: Jacques Navy MD,  December 20, 2009 12:41 PM

## 2010-04-11 NOTE — Progress Notes (Signed)
Summary: TSH  Phone Note Other Incoming Call back at Home Phone [phone]   Caller: Lurena Joiner- [phone] pt daughter  Summary of Call: Pt's daughter called and said she received a call from Dr Jeanine Luz nurse Racheal. She said they told her that her mother thyroid was low at .40. I have called racheal to clarify this, I left a message on her voicemail to call me back to clarify.  Just an FYI Initial call taken by: Ami Bullins CMA,  January 05, 2010 3:46 PM  Follow-up for Phone Call        Daugther called again, please advise. Result of TSH is 0.055 Follow-up by: Lamar Sprinkles, CMA,  January 05, 2010 5:13 PM  Additional Follow-up for Phone Call Additional follow up Details #1::        reduce levothyroxine dose to .075* with follow-up lab in 3 weeks.  244.9  * new Rx: levothyroxine #30 refill as needed, sig 1 by mouth once daily  Additional Follow-up by: Jacques Navy MD,  January 06, 2010 9:52 AM    Additional Follow-up for Phone Call Additional follow up Details #2::    informed pt and put labs in IDX Follow-up by: Ami Bullins CMA,  January 06, 2010 3:52 PM  New/Updated Medications: LEVOTHROID 75 MCG TABS (LEVOTHYROXINE SODIUM) 1 tablet once a day Prescriptions: LEVOTHROID 75 MCG TABS (LEVOTHYROXINE SODIUM) 1 tablet once a day  #30 x 2   Entered by:   Ami Bullins CMA   Authorized by:   Jacques Navy MD   Signed by:   Bill Salinas CMA on 01/06/2010   Method used:   Electronically to        [facility] [address] [phone]* (retail)       [address]       [place], [place]  [address]       Ph: [phone] or [phone]       Fax: [phone]   RxID:   [id number]

## 2010-04-11 NOTE — Medication Information (Signed)
Summary: ROV/TM  Anticoagulant Therapy  Managed by: Shelby Dubin, PharmD, BCPS, CPP Referring MD: Rollene Rotunda MD PCP: Jacques Navy MD Supervising MD: Excell Seltzer MD, Casimiro Needle Indication 1: Atrial Fibrillation (ICD-427.31) Lab Used: [facility] [place] [facility] Site: [facility] INR POC 2.3 INR RANGE 2 - 3  Dietary changes: no    Health status changes: no    Bleeding/hemorrhagic complications: no    Recent/future hospitalizations: no    Any changes in medication regimen? yes       Details: Started Humira injection 2 weeks ago  Recent/future dental: no  Any missed doses?: no       Is patient compliant with meds? yes       Allergies (verified): 1)  ! Plaquenil (Hydroxychloroquine Sulfate)  Anticoagulation Management History:      The patient is taking warfarin and comes in today for a routine follow up visit.  Positive risk factors for bleeding include an age of 75 years or older.  The bleeding index is 'intermediate risk'.  Positive CHADS2 values include History of HTN and Age > 75 years old.  The start date was 09/22/2002.  Her last INR was 5.3 RATIO.  Anticoagulation responsible provider: Excell Seltzer MD, Casimiro Needle.  INR POC: 2.3.  Cuvette Lot#: [id number].  Exp: 06/2010.    Anticoagulation Management Assessment/Plan:      The patient's current anticoagulation dose is Jantoven 5 mg  tabs: as directed.  The target INR is 2.0-3.0.  The next INR is due 05/03/2009.  Anticoagulation instructions were given to patient.  Results were reviewed/authorized by Shelby Dubin, PharmD, BCPS, CPP.  She was notified by Ysidro Evert, Pharm D Candidate.         Prior Anticoagulation Instructions: INR 2.3 Continue 2.5mg s everyday except 5mg s on Tuesdays. Recheck in 4 weeks.   Current Anticoagulation Instructions: INR: 2.3 Continue with same dosage of 1/2 tablet daily except 1 tablet on Tuesdays Recheck in 4 weeks

## 2010-04-11 NOTE — Medication Information (Signed)
Summary: rov/tm  Anticoagulant Therapy  Managed by: Bethena Midget, RN, BSN Referring MD: Rollene Rotunda MD PCP: Jacques Navy MD Supervising MD: Riley Kill MD, Maisie Fus Indication 1: Atrial Fibrillation (ICD-427.31) Lab Used: [facility] Site: [facility] INR POC 2.5 INR RANGE 2 - 3  Dietary changes: no    Health status changes: no    Bleeding/hemorrhagic complications: no    Recent/future hospitalizations: no    Any changes in medication regimen? yes       Details: Humira was discontined after 07/27/09 injection. Prednisone 3mg s daily started on 08/05/09   Recent/future dental: no  Any missed doses?: no       Is patient compliant with meds? yes      Comments: Has history of being on Predisone 3mg  daily in the past  Current Medications (verified): 1)  Calcium Carbonate-Vitamin D 600-125 Mg-Unit  Tabs (Calcium Carbonate-Vitamin D) .... Two Daily 2)  Folic Acid 1 Mg  Tabs (Folic Acid) .... Once Daily 3)  Alendronate Sodium 70 Mg  Tabs (Alendronate Sodium) .Marland Kitchen.. 1 Q Wk 4)  Jantoven 5 Mg  Tabs (Warfarin Sodium) .... As Directed 5)  Levothyroxine Sodium 125 Mcg Tabs (Levothyroxine Sodium) .Marland Kitchen.. 1 Once Daily 6)  Diltiazem Hcl Cr 240 Mg  Cp24 (Diltiazem Hcl) .... Once Daily 7)  Digoxin 0.125 Mg  Tabs (Digoxin) .... One Half Tablet Daily 8)  Methotrexate 2.5 Mg  Tabs (Methotrexate Sodium) .... 6 Tablets Weekly 9)  Prednisone 1 Mg Tabs (Prednisone) .... 3 Tabs By Mouth Daily  Allergies (verified): 1)  ! Plaquenil (Hydroxychloroquine Sulfate)  Anticoagulation Management History:      The patient is taking warfarin and comes in today for a routine follow up visit.  Positive risk factors for bleeding include an age of 48 years or older.  The bleeding index is 'intermediate risk'.  Positive CHADS2 values include History of HTN and Age > 75 years old.  The start date was 09/22/2002.  Her last INR was 5.3 RATIO.  Anticoagulation responsible provider: Riley Kill MD, Maisie Fus.  INR POC: 2.5.   Cuvette Lot#: [id number].  Exp: 10/2010.    Anticoagulation Management Assessment/Plan:      The patient's current anticoagulation dose is Jantoven 5 mg  tabs: as directed.  The target INR is 2.0-3.0.  The next INR is due 09/08/2009.  Anticoagulation instructions were given to patient.  Results were reviewed/authorized by Bethena Midget, RN, BSN.  She was notified by Bethena Midget, RN, BSN.         Prior Anticoagulation Instructions: INR 2.4 Continue 2.5mg  daily except 5mg  on Tuesdays. Recheck in 4 weeks.   Current Anticoagulation Instructions: INR 2.5 Continue 2.5mg s daily except 5mg s on Tuesdays. Recheck in 4 weeks. Ok per Weston Brass, [place] D

## 2010-04-11 NOTE — Medication Information (Signed)
Summary: rov/ez  Anticoagulant Therapy  Managed by: Bethena Midget, RN, BSN Referring MD: Rollene Rotunda MD PCP: Jacques Navy MD Supervising MD: Daleen Squibb MD, Maisie Fus Indication 1: Atrial Fibrillation (ICD-427.31) Lab Used: [facility] Site: [facility] INR POC 1.7 INR RANGE 2 - 3  Dietary changes: no    Health status changes: no    Bleeding/hemorrhagic complications: no    Recent/future hospitalizations: no    Any changes in medication regimen? no    Recent/future dental: no  Any missed doses?: no       Is patient compliant with meds? yes      Comments: Humira i injection B-weekly. This week is the week for the injeciton. Receives it tomorrow  Allergies: 1)  ! Plaquenil (Hydroxychloroquine Sulfate)  Anticoagulation Management History:      The patient is taking warfarin and comes in today for a routine follow up visit.  Positive risk factors for bleeding include an age of 28 years or older.  The bleeding index is 'intermediate risk'.  Positive CHADS2 values include History of HTN and Age > 12 years old.  The start date was 09/22/2002.  Her last INR was 5.3 RATIO.  Anticoagulation responsible provider: Daleen Squibb MD, Maisie Fus.  INR POC: 1.7.  Cuvette Lot#: [id number].  Exp: 06/2010.    Anticoagulation Management Assessment/Plan:      The patient's current anticoagulation dose is Jantoven 5 mg  tabs: as directed.  The target INR is 2.0-3.0.  The next INR is due 05/18/2009.  Anticoagulation instructions were given to patient.  Results were reviewed/authorized by Bethena Midget, RN, BSN.  She was notified by Bethena Midget, RN, BSN.         Prior Anticoagulation Instructions: INR: 2.3 Continue with same dosage of 1/2 tablet daily except 1 tablet on Tuesdays Recheck in 4 weeks  Current Anticoagulation Instructions: INR 1.7 Today take 7.5mg s then resume 2.5mg s everyday except 5mg s on Tuesdays. Recheck in 2 weeks.

## 2010-04-11 NOTE — Medication Information (Signed)
Summary: rov/cb  Anticoagulant Therapy  Managed by: Bethena Midget, RN, BSN Referring MD: Rollene Rotunda MD PCP: Jacques Navy MD Supervising MD: Jens Som MD, Arlys John Indication 1: Atrial Fibrillation (ICD-427.31) Lab Used: [facility] [place] Site: [facility] INR POC 2.4 INR RANGE 2 - 3  Dietary changes: no    Health status changes: no    Bleeding/hemorrhagic complications: no    Recent/future hospitalizations: no    Any changes in medication regimen? yes       Details: Presently on 1mg  daily of Prednisone. Due to complete dose this week.   Recent/future dental: no  Any missed doses?: no       Is patient compliant with meds? yes       Allergies: 1)  ! Plaquenil (Hydroxychloroquine Sulfate)  Anticoagulation Management History:      The patient is taking warfarin and comes in today for a routine follow up visit.  Positive risk factors for bleeding include an age of 74 years or older.  The bleeding index is 'intermediate risk'.  Positive CHADS2 values include History of HTN and Age > 36 years old.  The start date was 09/22/2002.  Her last INR was 5.3 RATIO.  Anticoagulation responsible provider: Jens Som MD, Arlys John.  INR POC: 2.4.  Cuvette Lot#: [id number].  Exp: 08/2010.    Anticoagulation Management Assessment/Plan:      The patient's current anticoagulation dose is Jantoven 5 mg  tabs: as directed.  The target INR is 2.0-3.0.  The next INR is due 08/10/2009.  Anticoagulation instructions were given to patient.  Results were reviewed/authorized by Bethena Midget, RN, BSN.  She was notified by Bethena Midget, RN, BSN.         Prior Anticoagulation Instructions: INR 2.3. Take 0.5 tablet daily except 1 tablet on Tuesdays.  Current Anticoagulation Instructions: INR 2.4 Continue 2.5mg  daily except 5mg  on Tuesdays. Recheck in 4 weeks.

## 2010-04-13 NOTE — Assessment & Plan Note (Signed)
Summary: evaluation for boil   Vital Signs:  Patient profile:   77 year old female Height:      61 inches Weight:      130 pounds BMI:     24.65 O2 Sat:      96 % on Room air Temp:     97.6 degrees F oral Pulse rate:   65 / minute BP sitting:   132 / 64  (left arm) Cuff size:   regular  Vitals Entered By: Bill Salinas CMA (March 29, 2010 9:36 AM)  O2 Flow:  Room air CC: pt here for evaluation of boil on her back / ab   Primary Care Provider:  Jacques Navy MD  CC:  pt here for evaluation of boil on her back / ab.  History of Present Illness: [name] [name] presents for treatment of a boil on her left back. she had a boil and it spontaneously drained while taking a hot shower. She reports that she saw solid white material come from the wound. The area does remain sore. She is on prednisone and MTX for RA. She has recently been on amoxicillin for dental infection.  She has had no fever or chills.   Current Medications (verified): 1)  Calcium Carbonate-Vitamin D 600-125 Mg-Unit  Tabs (Calcium Carbonate-Vitamin D) .... Two Daily 2)  Folic Acid 1 Mg  Tabs (Folic Acid) .... Once Daily 3)  Jantoven 5 Mg  Tabs (Warfarin Sodium) .... As Directed 4)  Levothroid 75 Mcg Tabs (Levothyroxine Sodium) .Marland Kitchen.. 1 Tablet Once A Day 5)  Diltiazem Hcl Cr 240 Mg  Cp24 (Diltiazem Hcl) .... Once Daily 6)  Digoxin 0.125 Mg  Tabs (Digoxin) .... One Half Tablet Daily 7)  Methotrexate 2.5 Mg  Tabs (Methotrexate Sodium) .... 6 Tablets Weekly 8)  Prednisone 5 Mg Tabs (Prednisone) .... Take 1 Tablet By Mouth Daily 9)  Hydrochlorothiazide 25 Mg Tabs (Hydrochlorothiazide) .... 1/2 Tablet Once A Day  Allergies (verified): 1)  ! 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  The patient denies fever, weight loss, chest pain, dyspnea on exertion, abdominal pain, abnormal bleeding, and enlarged lymph nodes.    Physical Exam  General:  Elderly white woman in NAD. She does walk with a cane and has a slowed gait. Neck:  supple.   Lungs:  normal respiratory effort.   Heart:  normal rate and regular rhythm.   Neurologic:  alert & oriented X3.   Skin:  erythematous furuncle at the left scapula with an open center. The area is 4-5 cm across. The lesin is tender. Cervical Nodes:  no anterior cervical adenopathy and no posterior cervical adenopathy.     Impression & Recommendations:  Problem # 1:  CARBUNCLE AND FURUNCLE OF TRUNK (ICD-680.2)  Furuncle with spontaneous partial drainage.  Plan - I&D under sterile conditions and pack           doxycycline 100mg  two times a day x 7  return 1/19 for repacking.  Orders: I&D Abscess, Simple / Single (10060)  Complete Medication List: 1)  Calcium Carbonate-vitamin D 600-125 Mg-unit Tabs (Calcium carbonate-vitamin d) .... Two daily 2)  Folic Acid 1 Mg Tabs (Folic acid) .... Once daily 3)  Jantoven 5 Mg Tabs (Warfarin sodium) .... As directed 4)  Levothroid 75 Mcg Tabs (Levothyroxine sodium) .Marland Kitchen.. 1 tablet once a day 5)  Diltiazem Hcl Cr 240 Mg Cp24 (Diltiazem hcl) .... Once daily 6)  Digoxin 0.125 Mg Tabs (Digoxin) .... One half tablet daily 7)  Methotrexate 2.5 Mg Tabs (Methotrexate sodium) .... 6 tablets weekly 8)  Prednisone 5 Mg Tabs  (Prednisone) .... Take 1 tablet by mouth daily 9)  Hydrochlorothiazide 25 Mg Tabs (Hydrochlorothiazide) .... 1/2 tablet once a day 10)  Doxycycline Hyclate 100 Mg Caps (Doxycycline hyclate) .Marland Kitchen.. 1 by mouth two times a day for furuncle on back Prescriptions: DOXYCYCLINE HYCLATE 100 MG CAPS (DOXYCYCLINE HYCLATE) 1 by mouth two times a day for furuncle on back  #14 x 0   Entered and Authorized by:   Jacques Navy MD   Signed by:   Jacques Navy MD on 03/29/2010   Method used:   Electronically to        [facility] [address] [phone]* (retail)       [address]       [place], [place]  [address]       Ph: [phone] or [phone]       Fax: [phone]   RxID:   [id number]    Orders Added: 1)  Est. Patient Level II [66440] 2)  I&D Abscess, Simple / Single [10060]     Procedure Note  Incision & Drainage: The patient complains of pain, redness, inflammation, and swelling. Indication: infected lesion  Procedure # 1: I & D with packing    Size (in cm): 3.0 x 3.0    Location: left scapular area    Comment: furuncle with redness and persistent swelling and fluctuance. Incised and expressed 5 cc coagluated material - looks like sebum. Pack with 10 cm iodoform    Instrument used: #11 blade    Anesthesia: 2% xylo with epi  Cleaned and prepped with: betadine Wound dressing: bandaid Additional Instructions: patient to return 1/19 for packing change

## 2010-04-13 NOTE — Assessment & Plan Note (Signed)
Summary: 427.31 3 yr rov  pfh,rn   Visit Type:  Follow-up Primary Provider:  Jacques Navy MD  CC:  Atrial Fibtrillation.  History of Present Illness: The patient presents for followup after a recent CVA. She had a pontine infarct with almost complete resolution of symptoms. She has atrial fibrillation but was on therapeutic Coumadin at the time. An echocardiogram demonstrated a mildly reduced ejection fraction but no source. She had no carotid stenosis. She has since recovered nicely. She is now at home living alone. She is ambulating independently. She denies any chest pressure, neck or arm discomfort. She has not noticed any palpitations, presyncope or syncope. She's had no weight gain or edema  Current Medications (verified): 1)  Calcium Carbonate-Vitamin D 600-125 Mg-Unit  Tabs (Calcium Carbonate-Vitamin D) .... Two Daily 2)  Folic Acid 1 Mg  Tabs (Folic Acid) .... Once Daily 3)  Jantoven 5 Mg  Tabs (Warfarin Sodium) .... As Directed 4)  Levothroid 75 Mcg Tabs (Levothyroxine Sodium) .Marland Kitchen.. 1 Tablet Once A Day 5)  Diltiazem Hcl Cr 240 Mg  Cp24 (Diltiazem Hcl) .... Once Daily 6)  Digoxin 0.125 Mg  Tabs (Digoxin) .... One Half Tablet Daily 7)  Methotrexate 2.5 Mg  Tabs (Methotrexate Sodium) .... 6 Tablets Weekly 8)  Prednisone 5 Mg Tabs (Prednisone) .... Take 1 Tablet By Mouth Daily  Allergies (verified): 1)  ! Plaquenil (Hydroxychloroquine Sulfate)  Past History:  Past Medical History: Reviewed history from 12/29/2009 and no changes required. 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 * VAGINAL CYST REMOVED 1998 APPENDECTOMY, HX OF (ICD-V45.79)    Review of Systems       As stated in the HPI and negative for all other systems.   Vital Signs:  Patient profile:   77 year old female Height:      61 inches Weight:      131 pounds BMI:     24.84 Pulse rate:   57 / minute Resp:     16 per minute BP sitting:   168 / 70  (right arm)  Vitals Entered By: Marrion Coy, CNA (March 16, 2010 2:49 PM)  Physical Exam  General:  Well developed, well nourished, in no acute distress. Head:  normocephalic and atraumatic Eyes:  PERRLA/EOM intact; conjunctiva and lids normal. Neck:  Neck supple, no JVD. No masses, thyromegaly or abnormal cervical nodes. Chest Wall:  no deformities or breast masses noted Lungs:  Clear bilaterally to auscultation and percussion. Abdomen:  Bowel sounds positive; abdomen soft and non-tender without masses, organomegaly, or hernias noted. No hepatosplenomegaly. Msk:  Back normal, normal gait. Muscle strength and tone normal. Extremities:  No clubbing or cyanosis. Neurologic:  Alert and oriented x 3. Skin:  Intact without lesions or rashes. Cervical Nodes:  no significant adenopathy Psych:  Normal affect.   Detailed Cardiovascular Exam  Neck    Carotids: Carotids full and equal bilaterally without bruits.      Neck Veins: Normal, no JVD.    Heart    Inspection: no deformities or lifts noted.      Palpation: normal PMI with no thrills palpable.      Auscultation: regular rate and rhythm, S1, S2 without murmurs, rubs, gallops, or clicks.    Vascular  Abdominal Aorta: no palpable masses, pulsations, or audible bruits.      Femoral Pulses: normal femoral pulses bilaterally.      Pedal Pulses: normal pedal pulses bilaterally.      Radial Pulses: normal radial pulses bilaterally.      Peripheral Circulation: no clubbing, cyanosis, or edema noted with normal capillary refill.     EKG  Procedure date:   03/16/2010  Findings:      Atrial fibrillation, left axis deviation, left anterior fascicular block, no acute ST-T wave changes  Impression & Recommendations:  Problem # 1:  CVA (ICD-434.91) The patient seems to have recovered nicely from this. At this point no change in therapy other than listed below as indicated.  Problem # 2:  HYPERTENSION (ICD-401.9) Her blood pressure remained slightly elevated. This is typically about 140 at home systolic. I will add a low-dose of hydrochlorothiazide 12.5 mg daily. Her potassium was excellent recently in the hospital so I don't think this should be supplemented but she can get a basic metabolic profile in about 2 weeks.  Problem # 3:  ATRIAL FIBRILLATION (ICD-427.31) She tolerates this rhythm with rate control and anticoagulation. No change in therapy is indicated. Orders: EKG w/ Interpretation (93000)  Patient Instructions: 1)  Your physician recommends that you schedule a follow-up appointment in: 4 months with Dr Antoine Poche 2)  Your physician has recommended you make the following change in your medication: start HTCZ 25 mg 1/2 tablet once a day Prescriptions: HYDROCHLOROTHIAZIDE 25 MG TABS (HYDROCHLOROTHIAZIDE) 1/2 tablet once a day  #30 x 6   Entered by:   Charolotte Capuchin, RN   Authorized by:   Rollene Rotunda, MD, [facility]   Signed by:   Charolotte Capuchin, RN on 03/16/2010   Method used:   Electronically to        [facility] [phone][facility] (retail)       [address]       [place], [place]  [address]       Ph: [phone] or [phone]       Fax: [phone]   RxID:   [id number]  I have reviewed and approved all prescriptions at the time of this visit. Rollene Rotunda, MD, [facility]  March 16, 2010 5:13 PM

## 2010-04-13 NOTE — Progress Notes (Signed)
  Phone Note Other Incoming   Details for Reason: Pts daughter called Summary of Call: Pts daughter called and states pt has a pus filled boil on her back. She states when pt took a warm shower yesterday pus drained out of the boil. She states the site is very red but does not feel warm to the touch. I informed her per MD that she should keep area clean with warm soapy water and apply warm compresses. Pt has appt today to see Dr Debby Bud Initial call taken by: Ami Bullins CMA,  March 29, 2010 9:00 AM

## 2010-04-13 NOTE — Assessment & Plan Note (Signed)
Summary: dressing change per dr norins/cd   Vital Signs:  Patient profile:   77 year old female Height:      61 inches Weight:      131 pounds BMI:     24.84 O2 Sat:      96 % on Room air Temp:     97.9 degrees F oral Pulse rate:   60 / minute BP sitting:   142 / 68  (left arm) Cuff size:   regular  Vitals Entered By: Bill Salinas CMA (March 30, 2010 2:19 PM)  O2 Flow:  Room air  Primary Care Provider:  Jacques Navy MD   History of Present Illness: [name] [name] presents for packing change after I&D of possible furuncle yesterday. She has done ok with only minimal discomfort.   Current Medications (verified): 1)  Calcium Carbonate-Vitamin D 600-125 Mg-Unit  Tabs (Calcium Carbonate-Vitamin D) .... Two Daily 2)  Folic Acid 1 Mg  Tabs (Folic Acid) .... Once Daily 3)  Jantoven 5 Mg  Tabs (Warfarin Sodium) .... As Directed 4)  Levothroid 75 Mcg Tabs (Levothyroxine Sodium) .Marland Kitchen.. 1 Tablet Once A Day 5)  Diltiazem Hcl Cr 240 Mg  Cp24 (Diltiazem Hcl) .... Once Daily 6)  Digoxin 0.125 Mg  Tabs (Digoxin) .... One Half Tablet Daily 7)  Methotrexate 2.5 Mg  Tabs (Methotrexate Sodium) .... 6 Tablets Weekly 8)  Prednisone 5 Mg Tabs (Prednisone) .... Take 1 Tablet By Mouth Daily 9)  Hydrochlorothiazide 25 Mg Tabs (Hydrochlorothiazide) .... 1/2 Tablet Once A Day 10)  Doxycycline Hyclate 100 Mg Caps (Doxycycline Hyclate) .Marland Kitchen.. 1 By Mouth Two Times A Day For Furuncle On Back  Allergies (verified): 1)  ! Plaquenil (Hydroxychloroquine Sulfate) PMH-FH-SH reviewed-no changes except otherwise noted  Review of Systems  The patient denies anorexia, fever, chest pain, suspicious skin lesions, abnormal bleeding, and enlarged lymph nodes.    Physical Exam  General:  Well-developed,well-nourished,in no acute distress; alert,appropriate and cooperative throughout examination Skin:  lesion at left scapula 3 x 3cm with 1 cm central opening with packing. Removed packing and noted what appeared to be  exudative material in wound.  Prepped with alcohol. Anesth with 2% xylocaine with epi. Prepped with betadiene. Explored the wound with curved hemostats and retreived what appeared to be a sebaceous cyst capsule. Repacked the wound with iodoform gauze and applied bandaid.   Impression & Recommendations:  Problem # 1:  SEBACEOUS CYST, INFECTED (ICD-706.2)  probable infected sebaceous cyst. Wound re-explored and debris removed. Repack.  Plan - continue doxycycline           return Jan 20 for dressing change.  Orders: I&D Abscess, Simple / Single (10060)  Complete Medication List: 1)  Calcium Carbonate-vitamin D 600-125 Mg-unit Tabs (Calcium carbonate-vitamin d) .... Two daily 2)  Folic Acid 1 Mg Tabs (Folic acid) .... Once daily 3)  Jantoven 5 Mg Tabs (Warfarin sodium) .... As directed 4)  Levothroid 75 Mcg Tabs (Levothyroxine sodium) .Marland Kitchen.. 1 tablet once a day 5)  Diltiazem Hcl Cr 240 Mg Cp24 (Diltiazem hcl) .... Once daily 6)  Digoxin 0.125 Mg Tabs (Digoxin) .... One half tablet daily 7)  Methotrexate 2.5 Mg Tabs (Methotrexate sodium) .... 6 tablets weekly 8)  Prednisone 5 Mg Tabs (Prednisone) .... Take 1 tablet by mouth daily 9)  Hydrochlorothiazide 25 Mg Tabs (Hydrochlorothiazide) .... 1/2 tablet once a day 10)  Doxycycline Hyclate 100 Mg Caps (Doxycycline hyclate) .Marland Kitchen.. 1 by mouth two times a day for furuncle on back  Orders Added: 1)  I&D Abscess, Simple / Single [10060]

## 2010-04-13 NOTE — Assessment & Plan Note (Signed)
Summary: f/u appt per dr Salisha Bardsley/#/cd   Vital Signs:  Patient profile:   77 year old female Height:      61 inches Weight:      131 pounds BMI:     24.84 O2 Sat:      97 % on Room air Temp:     97.6 degrees F oral Pulse rate:   67 / minute BP sitting:   152 / 80  (left arm) Cuff size:   regular  Vitals Entered By: Bill Salinas CMA (March 31, 2010 10:48 AM)  O2 Flow:  Room air  Primary Care Provider:  Jacques Navy MD   History of Present Illness: patient returns for dressing/packing change after I&D of infected sebaceous cyst. She reports that she did have some bloody drainage from the wound over-night but minimal discomfort.   Current Medications (verified): 1)  Calcium Carbonate-Vitamin D 600-125 Mg-Unit  Tabs (Calcium Carbonate-Vitamin D) .... Two Daily 2)  Folic Acid 1 Mg  Tabs (Folic Acid) .... Once Daily 3)  Jantoven 5 Mg  Tabs (Warfarin Sodium) .... As Directed 4)  Levothroid 75 Mcg Tabs (Levothyroxine Sodium) .Marland Kitchen.. 1 Tablet Once A Day 5)  Diltiazem Hcl Cr 240 Mg  Cp24 (Diltiazem Hcl) .... Once Daily 6)  Digoxin 0.125 Mg  Tabs (Digoxin) .... One Half Tablet Daily 7)  Methotrexate 2.5 Mg  Tabs (Methotrexate Sodium) .... 6 Tablets Weekly 8)  Prednisone 5 Mg Tabs (Prednisone) .... Take 1 Tablet By Mouth Daily 9)  Hydrochlorothiazide 25 Mg Tabs (Hydrochlorothiazide) .... 1/2 Tablet Once A Day 10)  Doxycycline Hyclate 100 Mg Caps (Doxycycline Hyclate) .Marland Kitchen.. 1 By Mouth Two Times A Day For Furuncle On Back  Allergies (verified): 1)  ! Plaquenil (Hydroxychloroquine Sulfate) PMH-FH-SH reviewed-no changes except otherwise noted  Review of Systems  The patient denies anorexia and fever.    Physical Exam  General:  Well-developed,well-nourished,in no acute distress; alert,appropriate and cooperative throughout examination Skin:  packing removed from wound. There is no accumulation of pus. The wound has beefy red base. The surrounding skin is mildly erythematous.  Wound  repacked in a sterile fashion with iodoform gause strip.    Impression & Recommendations:  Problem # 1:  SEBACEOUS CYST, INFECTED (ICD-706.2)  dressing/packing change. No sign of infection.  Plan - patient will return Jan 22nd for dressing change.   Orders: No Charge Patient Arrived (NCPA0) (NCPA0)  Complete Medication List: 1)  Calcium Carbonate-vitamin D 600-125 Mg-unit Tabs (Calcium carbonate-vitamin d) .... Two daily 2)  Folic Acid 1 Mg Tabs (Folic acid) .... Once daily 3)  Jantoven 5 Mg Tabs (Warfarin sodium) .... As directed 4)  Levothroid 75 Mcg Tabs (Levothyroxine sodium) .Marland Kitchen.. 1 tablet once a day 5)  Diltiazem Hcl Cr 240 Mg Cp24 (Diltiazem hcl) .... Once daily 6)  Digoxin 0.125 Mg Tabs (Digoxin) .... One half tablet daily 7)  Methotrexate 2.5 Mg Tabs (Methotrexate sodium) .... 6 tablets weekly 8)  Prednisone 5 Mg Tabs (Prednisone) .... Take 1 tablet by mouth daily 9)  Hydrochlorothiazide 25 Mg Tabs (Hydrochlorothiazide) .... 1/2 tablet once a day 10)  Doxycycline Hyclate 100 Mg Caps (Doxycycline hyclate) .Marland Kitchen.. 1 by mouth two times a day for furuncle on back   Orders Added: 1)  No Charge Patient Arrived (NCPA0) [NCPA0]

## 2010-04-13 NOTE — Medication Information (Signed)
Summary: rov/tm  Anticoagulant Therapy  Managed by: Weston Brass, PharmD Referring MD: Rollene Rotunda MD PCP: Jacques Navy MD Supervising MD: Myrtis Ser MD, Tinnie Gens Indication 1: Atrial Fibrillation (ICD-427.31) Indication 2: CVA Lab Used: [facility] Site: [facility] INR POC 3.4 INR RANGE 2 - 3  Dietary changes: yes       Details: Eating less green leafy vegetables  Health status changes: no    Bleeding/hemorrhagic complications: no    Recent/future hospitalizations: no    Any changes in medication regimen? yes       Details: Finished a course of doxycycline  Recent/future dental: no  Any missed doses?: no       Is patient compliant with meds? yes       Allergies: 1)  ! Plaquenil (Hydroxychloroquine Sulfate)  Anticoagulation Management History:      The patient is taking warfarin and comes in today for a routine follow up visit.  Positive risk factors for bleeding include an age of 75 years or older and history of CVA/TIA.  The bleeding index is 'intermediate risk'.  Positive CHADS2 values include History of HTN, Age > 6 years old, and Prior Stroke/CVA/TIA.  The start date was 09/22/2002.  Her last INR was 5.3 RATIO.  Anticoagulation responsible provider: Myrtis Ser MD, Tinnie Gens.  INR POC: 3.4.  Cuvette Lot#: [id number].  Exp: 12/2010.    Anticoagulation Management Assessment/Plan:      The patient's current anticoagulation dose is Jantoven 5 mg  tabs: as directed.  The target INR is 2.0-3.0.  The next INR is due 04/28/2010.  Anticoagulation instructions were given to daughter.  Results were reviewed/authorized by Weston Brass, PharmD.  She was notified by Linward Headland, PharmD candidate.         Prior Anticoagulation Instructions: INR 2.7 Continue 1/2 pill everyday except 1 pill on Tuesdays. Recheck in 4 weeks.    Current Anticoagulation Instructions: INR 3.4 (INR goal: 2-3)  Skip today's (Thursday) dose.  Resume normal schedule of 1/2 tablet everyday except 1 tablet on Tuesdays.   Recheck in 3 weeks.

## 2010-04-13 NOTE — Medication Information (Signed)
Summary: rov/nb  Anticoagulant Therapy  Managed by: Bethena Midget, RN, BSN Referring MD: Rollene Rotunda MD PCP: Jacques Navy MD Supervising MD: Jens Som MD, Arlys John Indication 1: Atrial Fibrillation (ICD-427.31) Indication 2: CVA Lab Used: [facility] Site: [facility] INR POC 2.7 INR RANGE 2 - 3  Dietary changes: no    Health status changes: no    Bleeding/hemorrhagic complications: no    Recent/future hospitalizations: no    Any changes in medication regimen? yes       Details: Amoxicillin completed approx 2 weeks ago   Recent/future dental: no  Any missed doses?: no       Is patient compliant with meds? yes       Allergies: 1)  ! Plaquenil (Hydroxychloroquine Sulfate)  Anticoagulation Management History:      The patient is taking warfarin and comes in today for a routine follow up visit.  Positive risk factors for bleeding include an age of 74 years or older and history of CVA/TIA.  The bleeding index is 'intermediate risk'.  Positive CHADS2 values include History of HTN, Age > 59 years old, and Prior Stroke/CVA/TIA.  The start date was 09/22/2002.  Her last INR was 5.3 RATIO.  Anticoagulation responsible [name] [name]: Jens Som MD, Arlys John.  INR POC: 2.7.  Cuvette Lot#: [id number].  Exp: 12/2010.    Anticoagulation Management Assessment/Plan:      The patient's current anticoagulation dose is Jantoven 5 mg  tabs: as directed.  The target INR is 2.0-3.0.  The next INR is due 04/06/2010.  Anticoagulation instructions were given to daughter.  Results were reviewed/authorized by Bethena Midget, RN, BSN.  She was notified by Bethena Midget, RN, BSN.         Prior Anticoagulation Instructions: INR 2.6 Continue previous dose of 0.5 tablet everyday except 1 tablet on Tuesday Recheck INR in 4 weeks  Current Anticoagulation Instructions: INR 2.7 Continue 1/2 pill everyday except 1 pill on Tuesdays. Recheck in 4 weeks.

## 2010-04-13 NOTE — Assessment & Plan Note (Signed)
Summary: CHANGE BANDAGE/NWS  #   Vital Signs:  Patient profile:   77 year old female Height:      61 inches O2 Sat:      98 % on Room air Temp:     97.5 degrees F oral Pulse rate:   62 / minute BP sitting:   138 / 62  (left arm) Cuff size:   regular  Vitals Entered By: Bill Salinas CMA (April 04, 2010 3:35 PM)  O2 Flow:  Room air  Primary Care Provider:  Jacques Navy MD   History of Present Illness: Mrs. [name] [name] presents for follow-up and dressing change for infected sebaceous cyst left back. She was seen Sunday, Jan 22 and there was still some exudative material in the wound. the wound was repacked with iodoform strip guaze. In the interval she has had no pain and there has been no drainage. She has had no fever and there is no surrounding erythema.  Current Medications (verified): 1)  Calcium Carbonate-Vitamin D 600-125 Mg-Unit  Tabs (Calcium Carbonate-Vitamin D) .... Two Daily 2)  Folic Acid 1 Mg  Tabs (Folic Acid) .... Once Daily 3)  Jantoven 5 Mg  Tabs (Warfarin Sodium) .... As Directed 4)  Levothroid 75 Mcg Tabs (Levothyroxine Sodium) .Marland Kitchen.. 1 Tablet Once A Day 5)  Diltiazem Hcl Cr 240 Mg  Cp24 (Diltiazem Hcl) .... Once Daily 6)  Digoxin 0.125 Mg  Tabs (Digoxin) .... One Half Tablet Daily 7)  Methotrexate 2.5 Mg  Tabs (Methotrexate Sodium) .... 6 Tablets Weekly 8)  Prednisone 5 Mg Tabs (Prednisone) .... Take 1 Tablet By Mouth Daily 9)  Hydrochlorothiazide 25 Mg Tabs (Hydrochlorothiazide) .... 1/2 Tablet Once A Day 10)  Doxycycline Hyclate 100 Mg Caps (Doxycycline Hyclate) .Marland Kitchen.. 1 By Mouth Two Times A Day For Furuncle On Back  Allergies (verified): 1)  ! Plaquenil (Hydroxychloroquine Sulfate) PMH-FH-SH reviewed-no changes except otherwise noted  Review of Systems  The patient denies fever, weight loss, dyspnea on exertion, and abdominal pain.   Derm:  Denies changes in color of skin and poor wound healing.  Physical Exam  General:  alert, well-developed, and  well-nourished.   Skin:  bandaid removed. The skin around the wound appears normal. There is no drainage. the wound base is red and beefy with no exudate or sebum.   Impression & Recommendations:  Problem # 1:  SEBACEOUS CYST, INFECTED (ICD-706.2) Wound looks good. No need to repack. She is instructed to keep a bandaid over the wound. From this point it should heal from the bottom up. She is given wound precautions with instructions to return for problems.   Complete Medication List: 1)  Calcium Carbonate-vitamin D 600-125 Mg-unit Tabs (Calcium carbonate-vitamin d) .... Two daily 2)  Folic Acid 1 Mg Tabs (Folic acid) .... Once daily 3)  Jantoven 5 Mg Tabs (Warfarin sodium) .... As directed 4)  Levothroid 75 Mcg Tabs (Levothyroxine sodium) .Marland Kitchen.. 1 tablet once a day 5)  Diltiazem Hcl Cr 240 Mg Cp24 (Diltiazem hcl) .... Once daily 6)  Digoxin 0.125 Mg Tabs (Digoxin) .... One half tablet daily 7)  Methotrexate 2.5 Mg Tabs (Methotrexate sodium) .... 6 tablets weekly 8)  Prednisone 5 Mg Tabs (Prednisone) .... Take 1 tablet by mouth daily 9)  Hydrochlorothiazide 25 Mg Tabs (Hydrochlorothiazide) .... 1/2 tablet once a day 10)  Doxycycline Hyclate 100 Mg Caps (Doxycycline hyclate) .Marland Kitchen.. 1 by mouth two times a day for furuncle on back   Orders Added: 1)  Est. Patient Level III [  99213] 

## 2010-05-01 ENCOUNTER — Telehealth: Payer: Self-pay | Admitting: Internal Medicine

## 2010-05-02 ENCOUNTER — Ambulatory Visit (INDEPENDENT_AMBULATORY_CARE_PROVIDER_SITE_OTHER): Payer: Medicare Other | Admitting: Internal Medicine

## 2010-05-02 ENCOUNTER — Encounter: Payer: Self-pay | Admitting: Internal Medicine

## 2010-05-02 ENCOUNTER — Encounter: Payer: Self-pay | Admitting: Cardiovascular Disease

## 2010-05-02 ENCOUNTER — Encounter (INDEPENDENT_AMBULATORY_CARE_PROVIDER_SITE_OTHER): Payer: Medicare Other

## 2010-05-02 DIAGNOSIS — I6350 Cerebral infarction due to unspecified occlusion or stenosis of unspecified cerebral artery: Secondary | ICD-10-CM

## 2010-05-02 DIAGNOSIS — I4891 Unspecified atrial fibrillation: Secondary | ICD-10-CM

## 2010-05-02 DIAGNOSIS — Z7901 Long term (current) use of anticoagulants: Secondary | ICD-10-CM

## 2010-05-02 DIAGNOSIS — S91009A Unspecified open wound, unspecified ankle, initial encounter: Secondary | ICD-10-CM

## 2010-05-02 LAB — CONVERTED CEMR LAB: POC INR: 2.5

## 2010-05-09 NOTE — Medication Information (Signed)
Summary: rov/kh  Anticoagulant Therapy  Managed by: Tammy Sours, PharmD Referring MD: Rollene Rotunda MD PCP: Jacques Navy MD Supervising MD: Excell Seltzer MD, Casimiro Needle Indication 1: Atrial Fibrillation (ICD-427.31) Indication 2: CVA Lab Used: [facility] Site: [facility] INR POC 2.5 INR RANGE 2 - 3  Dietary changes: no    Health status changes: yes       Details: Going to the MD to get a wound checked out that is not healing, will call clinic if gets put on antibiotics   Bleeding/hemorrhagic complications: no    Recent/future hospitalizations: no    Any changes in medication regimen? no    Recent/future dental: no  Any missed doses?: no       Is patient compliant with meds? yes       Allergies: 1)  ! Plaquenil (Hydroxychloroquine Sulfate)  Anticoagulation Management History:      The patient is taking warfarin and comes in today for a routine follow up visit.  Positive risk factors for bleeding include an age of 75 years or older and history of CVA/TIA.  The bleeding index is 'intermediate risk'.  Positive CHADS2 values include History of HTN, Age > 81 years old, and Prior Stroke/CVA/TIA.  The start date was 09/22/2002.  Her last INR was 5.3 RATIO.  Anticoagulation responsible provider: Excell Seltzer MD, Casimiro Needle.  INR POC: 2.5.  Cuvette Lot#: [id number].  Exp: 12/2010.    Anticoagulation Management Assessment/Plan:      The patient's current anticoagulation dose is Jantoven 5 mg  tabs: as directed.  The target INR is 2.0-3.0.  The next INR is due 05/30/2010.  Anticoagulation instructions were given to daughter.  Results were reviewed/authorized by Tammy Sours, PharmD.         Prior Anticoagulation Instructions: INR 3.4 (INR goal: 2-3)  Skip today's (Thursday) dose.  Resume normal schedule of 1/2 tablet everyday except 1 tablet on Tuesdays.  Recheck in 3 weeks.  Current Anticoagulation Instructions: INR 2.5  Continue current dose of 1/2 tablet everyday and 1 tablet on  Tuesdays. Recheck INR in 4 weeks.

## 2010-05-09 NOTE — Progress Notes (Signed)
Summary: OV TOMORROW  Phone Note Call from Patient   Caller: Lurena Joiner - Daughter [phone] Summary of Call: Pt has sore on her ankle for a couple week that is not healing and family is concerned. Scheduled for office visit tomorrow for eval.  Initial call taken by: Lamar Sprinkles, CMA,  May 01, 2010 5:13 PM

## 2010-05-09 NOTE — Assessment & Plan Note (Signed)
Summary: Sore on ankle that is not healing/SD   Vital Signs:  Patient profile:   77 year old female Height:      61 inches Weight:      130 pounds BMI:     24.65 O2 Sat:      96 % on Room air Temp:     98.1 degrees F oral Pulse rate:   62 / minute BP sitting:   150 / 80  (left arm) Cuff size:   regular  Vitals Entered By: Bill Salinas CMA (May 02, 2010 3:22 PM)  O2 Flow:  Room air CC: pt here for evaluation of wound on her left leg/ ab   Primary Care Provider:  Jacques Navy MD  CC:  pt here for evaluation of wound on her left leg/ ab.  History of Present Illness: [name] [name] presents due to a slow to heal fingernail laceration distal left LE. She reports there is tenderness to pressure. She has had no fever or rigors. There has been no swellng of the leg and red streaking. There have been no enlarged or tender inguinal lymph nodes.  Current Medications (verified): 1)  Calcium Carbonate-Vitamin D 600-125 Mg-Unit  Tabs (Calcium Carbonate-Vitamin D) .... Two Daily 2)  Folic Acid 1 Mg  Tabs (Folic Acid) .... Once Daily 3)  Jantoven 5 Mg  Tabs (Warfarin Sodium) .... As Directed 4)  Levothroid 75 Mcg Tabs (Levothyroxine Sodium) .Marland Kitchen.. 1 Tablet Once A Day 5)  Diltiazem Hcl Cr 240 Mg  Cp24 (Diltiazem Hcl) .... Once Daily 6)  Digoxin 0.125 Mg  Tabs (Digoxin) .... One Half Tablet Daily 7)  Methotrexate 2.5 Mg  Tabs (Methotrexate Sodium) .... 6 Tablets Weekly 8)  Prednisone 5 Mg Tabs (Prednisone) .... Take 1 Tablet By Mouth Daily 9)  Hydrochlorothiazide 25 Mg Tabs (Hydrochlorothiazide) .... 1/2 Tablet Once A Day 10)  Doxycycline Hyclate 100 Mg Caps (Doxycycline Hyclate) .Marland Kitchen.. 1 By Mouth Two Times A Day For Furuncle On Back  Allergies (verified): 1)  ! 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) HYPOTHYROIDISM (ICD-244.9) HYPERTENSION (ICD-401.9) ATRIAL FIBRILLATION (ICD-427.31) SJOGREN'S SYNDROME (ICD-710.2) GOITER (ICD-240.9) ARTHRITIS, RHEUMATOID (ICD-714.0) OSTEOPOROSIS (ICD-733.00)  Past Surgical History: Last updated: 04/14/2007 * VAGINAL CYST REMOVED 1998 APPENDECTOMY, HX OF (ICD-V45.79)   FH reviewed for relevance, SH/Risk Factors reviewed for relevance  Review of Systems  The patient denies anorexia, fever, weight loss, weight gain, chest pain, dyspnea on exertion, prolonged cough, abdominal pain, severe indigestion/heartburn, muscle weakness, difficulty walking, abnormal bleeding, and enlarged lymph nodes.    Physical Exam  General:  Well-developed,well-nourished,in no acute distress; alert,appropriate and cooperative throughout examination Eyes:  C&S clear Lungs:  normal respiratory effort.   Heart:  normal rate and regular rhythm.   Skin:  2 cm wound left distal LE with hard eschar. No surrounding edema, no heat to touch. The lesion is tender.  Wound on left back at he scapula is almost 100% healed after I&D with packing. Psych:  Oriented X3, memory intact for recent and remote, and good eye contact.     Impression & Recommendations:  Problem # 1:  WOUND, OPEN, LEG, WITHOUT COMPLICATION (ICD-891.0) Minor wound on leg without signs of infecton  Plan - betadine soaks followed by thorough washing with soap/water and wash cloth         apply wet to dry dressing  This should be done two times a day.            call for failure to improve or for signs of infection.  Complete Medication List: 1)  Calcium Carbonate-vitamin D 600-125 Mg-unit Tabs (Calcium carbonate-vitamin d) .... Two daily 2)  Folic Acid 1 Mg Tabs (Folic acid) .... Once daily 3)  Jantoven 5 Mg Tabs (Warfarin sodium) .... As directed 4)  Levothroid 75 Mcg Tabs (Levothyroxine sodium) .Marland Kitchen.. 1 tablet once a day 5)  Diltiazem Hcl Cr 240 Mg Cp24  (Diltiazem hcl) .... Once daily 6)  Digoxin 0.125 Mg Tabs (Digoxin) .... One half tablet daily 7)  Methotrexate 2.5 Mg Tabs (Methotrexate sodium) .... 6 tablets weekly 8)  Prednisone 5 Mg Tabs (Prednisone) .... Take 1 tablet by mouth daily 9)  Hydrochlorothiazide 25 Mg Tabs (Hydrochlorothiazide) .... 1/2 tablet once a day 10)  Doxycycline Hyclate 100 Mg Caps (Doxycycline hyclate) .Marland Kitchen.. 1 by mouth two times a day for furuncle on back   Orders Added: 1)  Est. Patient Level III [16109]

## 2010-05-18 ENCOUNTER — Encounter: Payer: Self-pay | Admitting: Internal Medicine

## 2010-05-25 ENCOUNTER — Encounter: Payer: Self-pay | Admitting: Internal Medicine

## 2010-05-25 ENCOUNTER — Ambulatory Visit (INDEPENDENT_AMBULATORY_CARE_PROVIDER_SITE_OTHER): Payer: Medicare Other | Admitting: Internal Medicine

## 2010-05-25 DIAGNOSIS — S81009A Unspecified open wound, unspecified knee, initial encounter: Secondary | ICD-10-CM

## 2010-05-25 LAB — HEMOGLOBIN A1C: Hgb A1c MFr Bld: 6.4 % — ABNORMAL HIGH (ref <5.7)

## 2010-05-25 LAB — LIPID PANEL
HDL: 55 mg/dL (ref >39)
LDL Cholesterol: 92 mg/dL (ref 0–99)
Total CHOL/HDL Ratio: 3.1 RATIO
VLDL: 22 mg/dL (ref 0–40)

## 2010-05-25 LAB — COMPREHENSIVE METABOLIC PANEL
AST: 24 U/L (ref 0–37)
Alkaline Phosphatase: 50 U/L (ref 39–117)
BUN: 10 mg/dL (ref 6–23)
BUN: 14 mg/dL (ref 6–23)
CO2: 22 mEq/L (ref 19–32)
Calcium: 8.6 mg/dL (ref 8.4–10.5)
Chloride: 108 mEq/L (ref 96–112)
Creatinine, Ser: 0.65 mg/dL (ref 0.4–1.2)
GFR calc Af Amer: >60 mL/min (ref >60)
GFR calc non Af Amer: >60 mL/min (ref >60)
GFR calc non Af Amer: >60 mL/min (ref >60)
Glucose, Bld: 163 mg/dL — ABNORMAL HIGH (ref 70–99)
Glucose, Bld: 177 mg/dL — ABNORMAL HIGH (ref 70–99)
Total Bilirubin: 0.4 mg/dL (ref 0.3–1.2)
Total Protein: 6.6 g/dL (ref 6.0–8.3)

## 2010-05-25 LAB — TROPONIN I: Troponin I: 0.07 ng/mL — ABNORMAL HIGH (ref 0.00–0.06)

## 2010-05-25 LAB — URINALYSIS, ROUTINE W REFLEX MICROSCOPIC
Glucose, UA: NEGATIVE mg/dL
Hgb urine dipstick: NEGATIVE

## 2010-05-25 LAB — CBC
HCT: 43.8 % (ref 36.0–46.0)
MCHC: 32.6 g/dL (ref 30.0–36.0)
MCHC: 32.6 g/dL (ref 30.0–36.0)
MCV: 96.1 fL (ref 78.0–100.0)
RDW: 14.9 % (ref 11.5–15.5)
RDW: 15 % (ref 11.5–15.5)
WBC: 8.9 10*3/uL (ref 4.0–10.5)

## 2010-05-25 LAB — CK TOTAL AND CKMB (NOT AT ARMC)
CK, MB: 2.4 ng/mL (ref 0.3–4.0)
Relative Index: INVALID (ref 0.0–2.5)
Total CK: 45 U/L (ref 7–177)

## 2010-05-25 LAB — URINE MICROSCOPIC-ADD ON

## 2010-05-25 LAB — DIFFERENTIAL
Basophils Absolute: 0.1 10*3/uL (ref 0.0–0.1)
Eosinophils Relative: 2 % (ref 0–5)
Lymphocytes Relative: 20 % (ref 12–46)

## 2010-05-25 LAB — GLUCOSE, CAPILLARY: Glucose-Capillary: 157 mg/dL — ABNORMAL HIGH (ref 70–99)

## 2010-05-25 LAB — PROTIME-INR
INR: 2.64 — ABNORMAL HIGH (ref 0.00–1.49)
Prothrombin Time: 27.9 seconds — ABNORMAL HIGH (ref 11.6–15.2)
Prothrombin Time: 28.3 seconds — ABNORMAL HIGH (ref 11.6–15.2)

## 2010-05-25 LAB — DIGOXIN LEVEL: Digoxin Level: 0.3 ng/mL — ABNORMAL LOW (ref 0.8–2.0)

## 2010-05-30 ENCOUNTER — Ambulatory Visit (INDEPENDENT_AMBULATORY_CARE_PROVIDER_SITE_OTHER): Payer: Medicare Other | Admitting: *Deleted

## 2010-05-30 DIAGNOSIS — I4891 Unspecified atrial fibrillation: Secondary | ICD-10-CM

## 2010-05-30 DIAGNOSIS — Z7901 Long term (current) use of anticoagulants: Secondary | ICD-10-CM | POA: Insufficient documentation

## 2010-05-30 DIAGNOSIS — I6350 Cerebral infarction due to unspecified occlusion or stenosis of unspecified cerebral artery: Secondary | ICD-10-CM

## 2010-05-30 LAB — POCT INR: INR: 2.2

## 2010-05-30 NOTE — Patient Instructions (Signed)
Continue on same dosage 1/2 tablet daily except 1 tablet on Tuesdays.  Recheck in 4 weeks.

## 2010-05-30 NOTE — Assessment & Plan Note (Signed)
Summary: wound care/lb   Vital Signs:  Patient profile:   77 year old female Height:      61 inches Weight:      128 pounds BMI:     24.27 O2 Sat:      96 % on Room air Temp:     97.6 degrees F oral Pulse rate:   63 / minute BP sitting:   132 / 70  (left arm) Cuff size:   regular  Vitals Entered By: Bill Salinas CMA (May 25, 2010 9:01 AM)  O2 Flow:  Room air CC: pt here for evaluation of wound on left leg/ ab   Primary Care Provider:  Jacques Navy MD  CC:  pt here for evaluation of wound on left leg/ ab.  History of Present Illness: Patient returns for a wound check. she has been applying wet- to- dry dressing but soaking it before removal. she has not been doin betadien soaks.   Current Medications (verified): 1)  Calcium Carbonate-Vitamin D 600-125 Mg-Unit  Tabs (Calcium Carbonate-Vitamin D) .... Two Daily 2)  Folic Acid 1 Mg  Tabs (Folic Acid) .... Once Daily 3)  Jantoven 5 Mg  Tabs (Warfarin Sodium) .... As Directed 4)  Levothroid 75 Mcg Tabs (Levothyroxine Sodium) .Marland Kitchen.. 1 Tablet Once A Day 5)  Diltiazem Hcl Cr 240 Mg  Cp24 (Diltiazem Hcl) .... Once Daily 6)  Digoxin 0.125 Mg  Tabs (Digoxin) .... One Half Tablet Daily 7)  Methotrexate 2.5 Mg  Tabs (Methotrexate Sodium) .... 6 Tablets Weekly 8)  Prednisone 5 Mg Tabs (Prednisone) .... Take 1 Tablet By Mouth Daily 9)  Hydrochlorothiazide 25 Mg Tabs (Hydrochlorothiazide) .... 1/2 Tablet Once A Day 10)  Doxycycline Hyclate 100 Mg Caps (Doxycycline Hyclate) .Marland Kitchen.. 1 By Mouth Two Times A Day For Furuncle On Back  Allergies (verified): 1)  ! Plaquenil (Hydroxychloroquine Sulfate) PMH-FH-SH reviewed-no changes except otherwise noted  Physical Exam  General:  Well-developed,well-nourished,in no acute distress; alert,appropriate and cooperative throughout examination Skin:  shallow wound posterior aspect distal left LE. Thereis yellow coagulum on the wound base. Surrounding tissue appears normal. No streaking, no  heat   Impression & Recommendations:  Problem # 1:  WOUND, OPEN, LEG, WITHOUT COMPLICATION (ICD-891.0) slow to heal. Care teechnique needs improvement  Plan - gave daughter specific instructions: wash with soap and water, rinse, betadiene soak, wet-to dry with dry removal. two times a day.           Return 1 week for wound check.   Complete Medication List: 1)  Calcium Carbonate-vitamin D 600-125 Mg-unit Tabs (Calcium carbonate-vitamin d) .... Two daily 2)  Folic Acid 1 Mg Tabs (Folic acid) .... Once daily 3)  Jantoven 5 Mg Tabs (Warfarin sodium) .... As directed 4)  Levothroid 75 Mcg Tabs (Levothyroxine sodium) .Marland Kitchen.. 1 tablet once a day 5)  Diltiazem Hcl Cr 240 Mg Cp24 (Diltiazem hcl) .... Once daily 6)  Digoxin 0.125 Mg Tabs (Digoxin) .... One half tablet daily 7)  Methotrexate 2.5 Mg Tabs (Methotrexate sodium) .... 6 tablets weekly 8)  Prednisone 5 Mg Tabs (Prednisone) .... Take 1 tablet by mouth daily 9)  Hydrochlorothiazide 25 Mg Tabs (Hydrochlorothiazide) .... 1/2 tablet once a day 10)  Doxycycline Hyclate 100 Mg Caps (Doxycycline hyclate) .Marland Kitchen.. 1 by mouth two times a day for furuncle on back   Orders Added: 1)  Est. Patient Level II [04540]

## 2010-06-01 ENCOUNTER — Ambulatory Visit (INDEPENDENT_AMBULATORY_CARE_PROVIDER_SITE_OTHER): Payer: Medicare Other | Admitting: Internal Medicine

## 2010-06-01 VITALS — BP 156/72 | HR 74 | Temp 97.6°F | Ht 61.0 in | Wt 128.0 lb

## 2010-06-01 DIAGNOSIS — L98499 Non-pressure chronic ulcer of skin of other sites with unspecified severity: Secondary | ICD-10-CM

## 2010-06-01 NOTE — Progress Notes (Signed)
  Subjective:    Patient ID: [name] [name], female    DOB: May 14, 1918, 77 y.o.   MRN: [id number]  HPI Patient presents for wound check: abrasion distal left LE which has become an ulcer. She has failed wet to dry dressings over the past two weeks. She still has sensitivity but no sings or symptoms to suggest infection.    Past Medical History  Diagnosis Date  . Unspecified cerebral artery occlusion with cerebral infarction   . Recurrent cold sores   . Xanthelasma of eyelid   . Ganglion cyst   . Anatomical narrow angle borderline glaucoma   . Hypothyroidism   . Hypertension   . Atrial fibrillation   . Sjogren's syndrome   . Goiter   . Rheumatoid arthritis   . Osteoporosis      Review of Systems  Constitutional: Negative.   HENT: Negative.   Respiratory: Negative.   Cardiovascular: Negative.   Gastrointestinal: Negative.   Musculoskeletal: Positive for back pain.  Neurological: Negative.        Objective:   Physical Exam  Constitutional: She appears well-developed and well-nourished.  HENT:  Head: Normocephalic and atraumatic.  Eyes: Conjunctivae are normal. Pupils are equal, round, and reactive to light.  Cardiovascular: Normal rate and regular rhythm.   Pulmonary/Chest: Effort normal.  Skin:       2x3 cm shallow ulcer posterior distal left leg with fibrinous base.          Assessment & Plan:  Skin ulcer left leg - applied unna boot. Patient to return in 1 week.

## 2010-06-01 NOTE — Patient Instructions (Signed)
Try to keep the dressing dry. For pain or the dressing becoming to tight it is ok to change the dressing. Keeping the leg up will minimize any swelling. Call me with any questions or problems

## 2010-06-08 ENCOUNTER — Ambulatory Visit (INDEPENDENT_AMBULATORY_CARE_PROVIDER_SITE_OTHER): Payer: Medicare Other | Admitting: Internal Medicine

## 2010-06-08 VITALS — BP 138/68 | HR 59 | Temp 97.5°F | Wt 129.0 lb

## 2010-06-08 DIAGNOSIS — I83009 Varicose veins of unspecified lower extremity with ulcer of unspecified site: Secondary | ICD-10-CM

## 2010-06-08 NOTE — Letter (Signed)
Summary: [facility]   Imported By: Sherian Rein 05/29/2010 08:45:34  _____________________________________________________________________  External Attachment:    Type:   Image     Comment:   External Document

## 2010-06-08 NOTE — Progress Notes (Signed)
  Subjective:    Patient ID: [name] [name], female    DOB: Jun 13, 1918, 77 y.o.   MRN: [id number]  HPI Mrs Andes presents for dressing change and wound check for stasis ulcer left distal LE posterior. She did have some discomfort at the top of the dressing due to tightness but no other discomfort. There has been very little weeping from venous stasis. She has had no fevers.     Review of Systems  Constitutional: Negative.   Respiratory: Negative.   Cardiovascular: Negative.   Musculoskeletal: Negative.   Hematological: Negative.        Objective:   Physical Exam  Constitutional: She appears well-developed and well-nourished. No distress.  Cardiovascular: Normal rate and regular rhythm.   Skin:       Left lower extremity with some subq hemorrhage. Wound is 2 x 4 mm, shallow, moderate granulation tissue at base, no surrounding erythema, no heat.           Assessment & Plan:  1. Stasis ulcer - continued improvement  Plan - reapply unna boot           Return in one week for removal and wound check           If all goes well will change to dry dressings next week.           Pt to call for problems

## 2010-06-15 ENCOUNTER — Ambulatory Visit (INDEPENDENT_AMBULATORY_CARE_PROVIDER_SITE_OTHER): Payer: Medicare Other | Admitting: Internal Medicine

## 2010-06-15 VITALS — BP 158/70 | HR 78 | Temp 97.5°F | Wt 130.0 lb

## 2010-06-15 DIAGNOSIS — I83009 Varicose veins of unspecified lower extremity with ulcer of unspecified site: Secondary | ICD-10-CM

## 2010-06-15 DIAGNOSIS — L97909 Non-pressure chronic ulcer of unspecified part of unspecified lower leg with unspecified severity: Secondary | ICD-10-CM

## 2010-06-17 ENCOUNTER — Encounter: Payer: Self-pay | Admitting: Internal Medicine

## 2010-06-17 NOTE — Progress Notes (Signed)
  Subjective:    Patient ID: [name] [name], female    DOB: 26-Jul-1918, 77 y.o.   MRN: [id number]  HPI [name] [name] presents for wound check - stasis ulcer posterior distal left LE after 3 weeks of unna boot treatment. She does report some minor discomfort and some swelling. She has a new abrasion of the distal lateral right LE.  I have reviewed the patient's medical history in detail and updated the computerized patient record.     Review of Systems  Constitutional: Negative.   Respiratory: Negative.   Cardiovascular: Negative.   Gastrointestinal: Negative.   Musculoskeletal: Positive for back pain and arthralgias.  Neurological: Negative.   Hematological: Negative.   Psychiatric/Behavioral: Negative.        Objective:   Physical Exam  Nursing note and vitals reviewed. Constitutional: She is oriented to person, place, and time. She appears well-developed and well-nourished. No distress.  HENT:  Head: Normocephalic and atraumatic.  Eyes: Conjunctivae and EOM are normal.  Neck: Neck supple.  Cardiovascular: Normal rate and regular rhythm.   Pulmonary/Chest: Effort normal and breath sounds normal.  Neurological: She is alert and oriented to person, place, and time.  Skin: Skin is warm and dry.          0.5 x 1.0 cm stasis ulcer posterior left leg with grey fibrinous base, minimal surrounding erythema. Not improved from last exam.  1.0x1.0 abrasion distal right LE.  Psychiatric: She has a normal mood and affect. Her behavior is normal.          Assessment & Plan:  1. Stasis ulcer - failure to improve.  Plan - will refer to the wound center for treatment.

## 2010-06-21 ENCOUNTER — Encounter (HOSPITAL_BASED_OUTPATIENT_CLINIC_OR_DEPARTMENT_OTHER): Payer: Medicare Other | Attending: Internal Medicine

## 2010-06-21 DIAGNOSIS — M810 Age-related osteoporosis without current pathological fracture: Secondary | ICD-10-CM | POA: Insufficient documentation

## 2010-06-21 DIAGNOSIS — Z79899 Other long term (current) drug therapy: Secondary | ICD-10-CM | POA: Insufficient documentation

## 2010-06-21 DIAGNOSIS — L97809 Non-pressure chronic ulcer of other part of unspecified lower leg with unspecified severity: Secondary | ICD-10-CM | POA: Insufficient documentation

## 2010-06-21 DIAGNOSIS — I4891 Unspecified atrial fibrillation: Secondary | ICD-10-CM | POA: Insufficient documentation

## 2010-06-21 DIAGNOSIS — I10 Essential (primary) hypertension: Secondary | ICD-10-CM | POA: Insufficient documentation

## 2010-06-21 DIAGNOSIS — M069 Rheumatoid arthritis, unspecified: Secondary | ICD-10-CM | POA: Insufficient documentation

## 2010-06-21 DIAGNOSIS — Z9089 Acquired absence of other organs: Secondary | ICD-10-CM | POA: Insufficient documentation

## 2010-06-21 DIAGNOSIS — Z7901 Long term (current) use of anticoagulants: Secondary | ICD-10-CM | POA: Insufficient documentation

## 2010-06-21 DIAGNOSIS — I872 Venous insufficiency (chronic) (peripheral): Secondary | ICD-10-CM | POA: Insufficient documentation

## 2010-06-27 ENCOUNTER — Ambulatory Visit (INDEPENDENT_AMBULATORY_CARE_PROVIDER_SITE_OTHER): Payer: Medicare Other | Admitting: *Deleted

## 2010-06-27 DIAGNOSIS — I6350 Cerebral infarction due to unspecified occlusion or stenosis of unspecified cerebral artery: Secondary | ICD-10-CM

## 2010-06-27 DIAGNOSIS — I4891 Unspecified atrial fibrillation: Secondary | ICD-10-CM

## 2010-07-12 ENCOUNTER — Other Ambulatory Visit: Payer: Self-pay | Admitting: Internal Medicine

## 2010-07-13 ENCOUNTER — Encounter (HOSPITAL_BASED_OUTPATIENT_CLINIC_OR_DEPARTMENT_OTHER): Payer: Medicare Other | Attending: Internal Medicine

## 2010-07-13 DIAGNOSIS — M069 Rheumatoid arthritis, unspecified: Secondary | ICD-10-CM | POA: Insufficient documentation

## 2010-07-13 DIAGNOSIS — Z79899 Other long term (current) drug therapy: Secondary | ICD-10-CM | POA: Insufficient documentation

## 2010-07-13 DIAGNOSIS — M810 Age-related osteoporosis without current pathological fracture: Secondary | ICD-10-CM | POA: Insufficient documentation

## 2010-07-13 DIAGNOSIS — I872 Venous insufficiency (chronic) (peripheral): Secondary | ICD-10-CM | POA: Insufficient documentation

## 2010-07-13 DIAGNOSIS — Z7901 Long term (current) use of anticoagulants: Secondary | ICD-10-CM | POA: Insufficient documentation

## 2010-07-13 DIAGNOSIS — L97809 Non-pressure chronic ulcer of other part of unspecified lower leg with unspecified severity: Secondary | ICD-10-CM | POA: Insufficient documentation

## 2010-07-13 DIAGNOSIS — I4891 Unspecified atrial fibrillation: Secondary | ICD-10-CM | POA: Insufficient documentation

## 2010-07-13 DIAGNOSIS — Z9089 Acquired absence of other organs: Secondary | ICD-10-CM | POA: Insufficient documentation

## 2010-07-13 DIAGNOSIS — I10 Essential (primary) hypertension: Secondary | ICD-10-CM | POA: Insufficient documentation

## 2010-07-20 ENCOUNTER — Ambulatory Visit (INDEPENDENT_AMBULATORY_CARE_PROVIDER_SITE_OTHER): Payer: Medicare Other | Admitting: *Deleted

## 2010-07-20 DIAGNOSIS — I4891 Unspecified atrial fibrillation: Secondary | ICD-10-CM

## 2010-07-20 DIAGNOSIS — I6350 Cerebral infarction due to unspecified occlusion or stenosis of unspecified cerebral artery: Secondary | ICD-10-CM

## 2010-07-25 NOTE — Assessment & Plan Note (Signed)
[facility]                                 ON-CALL NOTE   NAME:Conry, HALEN MOSSBARGER                    MRN:          [id number]  DATE:11/10/2006                            DOB:          21-Jun-1918    [phone]   Patient of Dr. Debby Bud.   Family called because she had a sudden onset of hemoptysis and shortness  of breath and sent to ER for evaluation.     Jeffrey A. Tawanna Cooler, MD  Electronically Signed    JAT/MedQ  DD: 11/11/2006  DT: 11/11/2006  Job #: [id number]

## 2010-07-25 NOTE — Assessment & Plan Note (Signed)
[facility]                           PRIMARY CARE OFFICE NOTE   NAME:Patti, MALAVIKA LIRA                    MRN:          [id number]  DATE:11/06/2006                            DOB:          1918/10/08    Ms. Schwartzkopf is an 77 year old woman last seen in the office for a  general examination January 03, 2006. Her past medical history includes  a history of vaginal hysterectomy and appendectomy. Her medical  illnesses include osteoporosis and rheumatoid arthritis.   The patient recently developed a respiratory illness and was seen at  Urgent Care by Dr. Earlene Plater. This was on the 25th. She was found at that  time to have a white count of 21,000. She had an abnormal chest x-ray  suggestive of pneumonia. She was started on Levaquin after being given  Rocephin, I believe, 500 mg IM in the office.   The patient returned to Urgent Care earlier today. She was found to have  an elevated white count of 24,000. She was noted to have increasing work  of breathing and complained of pleuritic chest pain and discomfort. She  had decreased breath sounds on the right. I was contacted by Urgent Care  to help with further evaluation.   The patient was urgently sent for CT angio of the chest which was  negative for pulmonary embolus. This revealed the patient to have a  moderate right pleural effusion. She had a ground-glass appearance  suggestive of atypical pneumonitis versus sarcoidosis. She had  mediastinal hilar adenopathy that could be secondary to acute infection.   CURRENT MEDICATIONS:  1. Calcium with D.  2. Folic acid.  3. Actonel 35 mg weekly.  4. Celebrex 200 mg daily.  5. Coumadin as directed.  6. Synthroid 112 mcg daily.  7. Diltiazem 240 mg daily.  8. Digoxin 0.0625 mg daily.  9. Methotrexate 2.5 mg 6 tablets weekly.   REVIEW OF SYSTEMS:  The patient has had no fevers or chills. No ophthal,  ENT or cardiovascular complaints. The patient has  respiratory difficulty  with discomfort in her chest wall with deep inspiration. No GI or GU  complaints.   PHYSICAL EXAMINATION:  VITAL SIGNS:  Temperature was 96.9, blood  pressure was 105/67, pulse 89, weight 119. O2 sat on room air was 92%.  GENERAL:  This is an elderly woman, she is in no acute distress. She has  no increased work of breathing.  HEENT:  Unremarkable.  CHEST:  The patient has decreased breath sounds at the right base. She  had no rales, she had no wheezing, she had no increased work of  breathing using no accessory muscles of respiration.  CARDIOVASCULAR:  Unremarkable with a regular rate and rhythm.   ASSESSMENT/PLAN:  Pulmonary/ID:  Patient with what appears to be an  atypical infection. She has had a marked leukocytosis. She has had no  fever or cough. Would be concerned for possible organism such as  Haemophilus, influenza, Mycoplasma pneumoniae versus Legionella. With  this rapid onset of her symptoms with leukocytosis and CT and x-ray  changes, I doubt this represents an underlying  malignant disorder.   Discussed options of treatment with the patient including an inpatient  option versus outpatient. I believe that she is medically stable and can  be managed as an outpatient with close followup. The patient has opted  for the latter.   PLAN:  Continue Levaquin 500 mg daily. Will add clarithromycin 500 mg  b.i.d. for 7 days. Will add Celebrex 100 mg b.i.d. for pleuritic chest  pain and the patient is given careful GI precaution instructions. The  patient is to do deep breathing at least 5 minutes every hour due to  known atelectasis already noted on CT scan.   The patient is carefully instructed that if she has increased work of  breathing, shortness of breath, fever or any other symptoms she is to  call for direct hospital admission. If the patient does well on the  regimen above, she will return to the office on Friday, August 29, for  reevaluation  including chest x-ray. The patient was put on Lasix 20 mg  daily for her effusion and if this persists she may be a candidate for  ultrasound-guided thoracentesis.     Rosalyn Gess Norins, MD  Electronically Signed    MEN/MedQ  DD: 11/07/2006  DT: 11/07/2006  Job #: [id number]

## 2010-07-25 NOTE — Assessment & Plan Note (Signed)
[facility]                            CARDIOLOGY OFFICE NOTE   NAME:[name] [name], [name] [name]                    MRN:          [id number]  DATE:03/11/2008                            DOB:          06/19/1918    PRIMARY:  Rosalyn Gess. Norins, MD   REASON FOR PRESENTATION:  Evaluate the patient with atrial fibrillation.   HISTORY OF PRESENT ILLNESS:  The patient presents for followup of the  above.  It has been about 4 years since I last saw her.  She had been  followed in [facility] and per Medicare guidelines, she is  required to be seen here.  She tolerates Coumadin and has had no  problems with this.  She has good rate control with her atrial  fibrillation which has been permanent.  She still lives by herself.  She  is going to be 89 in few days.  She does some chores in her house.  She  does not have any presyncope or syncope.  She does not notice any  palpitations.  She does not have any lightheadedness.  She has no chest  discomfort, neck or arm discomfort.  She has no shortness of breath.   PAST MEDICAL HISTORY:  Hypothyroidism, rheumatoid arthritis.   PAST SURGICAL HISTORY:  Appendectomy.   ALLERGIES:  PLAQUENIL.   MEDICATIONS:  1. Synthroid 112 mcg daily.  2. Coumadin.  3. Diltiazem 240 mg daily.  4. Digoxin 125 mcg one-half tablet daily.  5. Fosamax 70 mg weekly.  6. Methotrexate.  7. Folic acid.   SOCIAL HISTORY:  The patient is retired.  She is a widow.  She has one  daughter.  She quit smoking 25 years ago.  She lives by herself.   FAMILY HISTORY:  Noncontributory for early coronary artery disease.   REVIEW OF SYSTEMS:  As stated in the HPI, negative for all other systems  except for joint pains.   PHYSICAL EXAMINATION:  GENERAL:  The patient is very pleasant and looks  younger than her stated age.  VITAL SIGNS:  Blood pressure 153/74, heart rate 63 and regular, weight  125 pounds, body mass index 24.  HEENT:  Eyes  unremarkable; pupils equal, round, and reactive to light;  fundi not visualized; oral mucosa unremarkable.  NECK:  No jugular venous distention at 45 degrees, carotid upstroke  brisk and symmetric, no bruits, no thyromegaly.  LYMPHATICS:  No cervical, axillary, inguinal adenopathy.  LUNGS:  Clear to auscultation bilaterally.  BACK:  No costovertebral angle tenderness.  CHEST:  Unremarkable.  HEART:  PMI not displaced or sustained, S1 and S2 within normal limits,  no S3, no murmurs.  ABDOMEN:  Flat, positive bowel sounds, normal in frequency and pitch, no  bruits, no rebound, no guarding, no midline pulsatile mass, no  hepatomegaly, no splenomegaly.  SKIN:  No rashes, no nodules.  EXTREMITIES:  Pulse 2+ throughout, no edema, no cyanosis, no clubbing.  NEUROLOGY:  Oriented to person, place, and time.  Cranial nerves II  through XII grossly intact, motor grossly intact.   ASSESSMENT AND PLAN:  1. Atrial fibrillation.  The patient is tolerating this rhythm with      rate control and anticoagulation.  No further therapy is warranted.  2. Hypertension.  Blood pressure is elevated today.  I went back and      looked at the trend in Dr. Debby Bud' office.  Typically, she is in      the 140 range that would spike a little bit higher.  Her family      would keep an eye on this at home with a blood pressure diary and      this can be managed as needed.  3. Rheumatoid arthritis.  Per Dr. Debby Bud and Dr. Marcy Siren.  4. Followup.  We will see her back in 1 year or sooner if needed.     Rollene Rotunda, MD, [facility]  Electronically Signed    JH/MedQ  DD: 03/11/2008  DT: 03/12/2008  Job #: [id number]   cc:   Rosalyn Gess. Norins, MD

## 2010-07-28 NOTE — Assessment & Plan Note (Signed)
[facility]                             PRIMARY CARE OFFICE NOTE   NAME:[name], [name] COUVILLON                    MRN:          [id number]  DATE:01/03/2006                            DOB:          [birth year]    [name] [name] is a pleasant 77 year old woman who presents for evaluation and  followup of her osteoporosis and atrial fibrillation.  The patient was last  seen in the office November 03, 2004.   INTERVAL HISTORY:  The patient has been doing well with no new medical  problems.  She has been attending the [facility] on a regular basis,  maintaining good control.  The patient has followup with Dr. Syliva Overman.  The last office note on the chart was from May of 2007, and the patient  reports that she was seen in the last several weeks.   Past medical history, family history and social history are well documented  in my note of November 03, 2004 without significant change.   CURRENT MEDICATIONS:  1. Calcium with D 1200 mg daily.  2. Folic acid 1 mg daily.  3. Actonel 35 mg weekly.  4. Celebrex 200 mg daily p.r.n.  5. Coumadin as directed.  6. Synthroid 112 mcg daily.  7. Diltiazem 240 mg daily.  8. Digoxin 0.625 mg daily.  9. Methotrexate 2.5 mg tabs six tablets once a week.   HEALTH MAINTENANCE:  The patient's last mammogram was September 26, 2005, and was  a normal study with no abnormalities reported.  The patient did have a flex  sig in 2001, pneumonia vaccine in 2000, last DT in 1995.   REVIEW OF SYSTEMS:  The patient has had no constitutional, cardiovascular,  respiratory, GI or GU discomfort.  She has had only mild discomfort from her  arthritis.   PHYSICAL EXAMINATION:  Temperature was 97.5, blood pressure 118/72, pulse  79, weight 124.  GENERAL APPEARANCE:  Well-developed, well-nourished, spry-appearing  Caucasian woman in no acute distress.  HEENT:  Normocephalic, atraumatic.  EACs and TMs were normal.  Oropharynx  revealed an  upper partial denture.  The patient is missing molar and  premolars on the mandible.  No buccal lesions were noted, posterior pharynx  was clear, conjunctivae and sclerae were clear, pupils equal, round and  reactive to light and accommodation.  Funduscopic exam with hand-held  instrument was unremarkable with normal disk margins and vessels.  NECK:  Supple without thyromegaly.  NODES:  No adenopathy was noted in the cervical or supraclavicular regions.  CHEST:  No CVA tenderness.  LUNGS:  Clear to auscultation and percussion.  BREAST:  Exam reveals normal skin.  Breasts were pendulous, nipples without  discharge.  No fixed mass, lesion or abnormality was noted.  There is no  axillary adenopathy.  CARDIOVASCULAR:  2+ radial pulse, no JVD, carotid bruits.  Showed a regular  rate and rhythm, no murmurs, rubs or gallops.  ABDOMEN:  Soft, no guarding, no rebound.  No organosplenomegaly was noted.  Pelvic and rectal exams deferred.  EXTREMITIES:  Without clubbing, cyanosis, edema or deformity.  NEUROLOGIC:  Exam was unremarkable,  with the patient being awake, alert,  oriented to person, place, time and context, with no focal findings.  SKIN:  Clear.   LABORATORY:  The patient reports she has had recent laboratory with Dr. Syliva Overman.  She also reports she has lab coming up.  I have asked her to  obtain a TSH and free T4 at her next lab call, and to ask Dr. Jeanine Luz  office to forward the labs to me.   ASSESSMENT AND PLAN:  1. Osteoporosis.  The patient appears to be stable at this time.  She is      on Actonel.  At this point, given her age, would not repeat a bone      density study.  2. Atrial fibrillation.  The patient is stable on her combination of      diltiazem and digoxin, as well also tolerating Coumadin.  Her      examination revealed a rate control.  Her blood pressure is excellent.      PLAN:  The patient to continue on her current regimen.  The patient      will  continue to follow up in the [facility].  3. Hypothyroid disease.  Laboratories ordered and pending.  We will make      appropriate adjustments in the medication based on results.   HEALTH MAINTENANCE:  The patient has had a mammogram, as noted.  She is  current and up to date with immunization status.   In summary, this is a delightful woman who is medically stable at this time.  She will return to see me on an as-needed basis.    ______________________________  Rosalyn Gess Norins, MD    MEN/MedQ  DD: 01/04/2006  DT: 01/05/2006  Job #: [id number]   cc:   Cephus Slater. Hulan Amato, M.D.

## 2010-08-16 ENCOUNTER — Ambulatory Visit (INDEPENDENT_AMBULATORY_CARE_PROVIDER_SITE_OTHER): Payer: Medicare Other | Admitting: *Deleted

## 2010-08-16 DIAGNOSIS — I6350 Cerebral infarction due to unspecified occlusion or stenosis of unspecified cerebral artery: Secondary | ICD-10-CM

## 2010-08-16 DIAGNOSIS — I4891 Unspecified atrial fibrillation: Secondary | ICD-10-CM

## 2010-09-06 ENCOUNTER — Ambulatory Visit (INDEPENDENT_AMBULATORY_CARE_PROVIDER_SITE_OTHER): Payer: Medicare Other | Admitting: *Deleted

## 2010-09-06 DIAGNOSIS — I6350 Cerebral infarction due to unspecified occlusion or stenosis of unspecified cerebral artery: Secondary | ICD-10-CM

## 2010-09-06 DIAGNOSIS — I4891 Unspecified atrial fibrillation: Secondary | ICD-10-CM

## 2010-09-06 LAB — POCT INR: INR: 2.4

## 2010-09-17 ENCOUNTER — Other Ambulatory Visit: Payer: Self-pay | Admitting: Internal Medicine

## 2010-09-29 ENCOUNTER — Encounter: Payer: Self-pay | Admitting: Cardiology

## 2010-10-03 ENCOUNTER — Encounter: Payer: Self-pay | Admitting: Cardiology

## 2010-10-03 ENCOUNTER — Ambulatory Visit (INDEPENDENT_AMBULATORY_CARE_PROVIDER_SITE_OTHER): Payer: Medicare Other | Admitting: Cardiology

## 2010-10-03 ENCOUNTER — Ambulatory Visit (INDEPENDENT_AMBULATORY_CARE_PROVIDER_SITE_OTHER): Payer: Medicare Other | Admitting: *Deleted

## 2010-10-03 DIAGNOSIS — I4891 Unspecified atrial fibrillation: Secondary | ICD-10-CM

## 2010-10-03 DIAGNOSIS — I6350 Cerebral infarction due to unspecified occlusion or stenosis of unspecified cerebral artery: Secondary | ICD-10-CM

## 2010-10-03 DIAGNOSIS — I10 Essential (primary) hypertension: Secondary | ICD-10-CM

## 2010-10-03 NOTE — Assessment & Plan Note (Signed)
She tolerates his rhythm with rate control and anticoagulation. No change in therapy is indicated.

## 2010-10-03 NOTE — Assessment & Plan Note (Signed)
Her blood pressure is better controlled on the medications as listed. We reviewed her medications at length. She should take her hydrochlorothiazide first thing in the morning. She will hopefully begin to do this.

## 2010-10-03 NOTE — Progress Notes (Signed)
HPI The patient returns for followup of her atrial fibrillation. Recently she had indigestion after eating [place] food. She has not had any tachycardia palpitations, presyncope or syncope. She's had no new shortness of breath, PND or orthopnea. Of note at the last appointment I added hydrochlorothiazide and her blood pressure has been much better controlled. However, she separates her pills all at one hour and by the time she gets to the hydrochlorothiazide is afternoon. This causes urination at night.  Allergies  Allergen Reactions  . Hydroxychloroquine Sulfate     REACTION: rash    Current Outpatient Prescriptions  Medication Sig Dispense Refill  . Calcium Carbonate-Vitamin D 600-125 MG-UNIT TABS Take by mouth daily. Two tabs daily       . digoxin (LANOXIN) 0.125 MG tablet take 1/2 tablet by mouth once daily  15 tablet  5  . diltiazem (CARDIZEM CD) 240 MG 24 hr capsule take 1 capsule by mouth once daily  30 capsule  5  . folic acid (FOLVITE) 1 MG tablet Take 1 mg by mouth daily.        . hydrochlorothiazide 25 MG tablet Take 25 mg by mouth daily. 1/2 tablet once a day       . levothyroxine (SYNTHROID, LEVOTHROID) 75 MCG tablet take 1 tablet by mouth once daily  30 tablet  2  . methotrexate 2.5 MG tablet Take by mouth once a week.        . predniSONE (DELTASONE) 5 MG tablet Take 5 mg by mouth daily.        Marland Kitchen warfarin (COUMADIN) 5 MG tablet take by mouth as directed  30 tablet  2    Past Medical History  Diagnosis Date  . Unspecified cerebral artery occlusion with cerebral infarction   . Recurrent cold sores   . Xanthelasma of eyelid   . Ganglion cyst   . Anatomical narrow angle borderline glaucoma   . Hypothyroidism   . Hypertension   . Atrial fibrillation   . Sjogren's syndrome   . Goiter   . Rheumatoid arthritis   . Osteoporosis     Past Surgical History  Procedure Date  . Appendectomy   . Vaginal cyst removed 1998     ROS:  HOH.  Otherwise as stated in the HPI and  negative for all other systems.  PHYSICAL EXAM BP 124/60  Pulse 92  Resp 16  Ht 5\' 1"  (1.549 m)  Wt 126 lb (57.153 kg)  BMI 23.81 kg/m2 GENERAL:  Well appearing HEENT:  Pupils equal round and reactive, fundi not visualized, oral mucosa unremarkable NECK:  No jugular venous distention, waveform within normal limits, carotid upstroke brisk and symmetric, no bruits, no thyromegaly LYMPHATICS:  No cervical, inguinal adenopathy LUNGS:  Clear to auscultation bilaterally BACK:  No CVA tenderness CHEST:  Unremarkable HEART:  PMI not displaced or sustained,S1 and S2 within normal limits, no S3, no S4, no clicks, no rubs, no murmurs, irregular ABD:  Flat, positive bowel sounds normal in frequency in pitch, no bruits, no rebound, no guarding, no midline pulsatile mass, no hepatomegaly, no splenomegaly EXT:  2 plus upper pulses, diminished lower extremity appt.,  trace edema, no cyanosis no clubbing SKIN:  No rashes no nodules, chronic venous stasis changes NEURO:  Cranial nerves II through XII grossly intact, motor grossly intact throughout PSYCH:  Cognitively intact, oriented to person place and time  EKG:  Atrial fibrillation, left axis deviation, poor anterior R-wave progression, no acute ST-T wave changes.  ASSESSMENT AND  PLAN

## 2010-10-03 NOTE — Patient Instructions (Signed)
Continue medications as listed. Follow up in 6 months with Dr Antoine Poche.  You will receive a letter in the mail 2 months before you are due.  Please call us when you receive this letter to schedule your follow up appointment.

## 2010-10-04 ENCOUNTER — Encounter: Payer: Medicare Other | Admitting: *Deleted

## 2010-10-18 ENCOUNTER — Ambulatory Visit (INDEPENDENT_AMBULATORY_CARE_PROVIDER_SITE_OTHER): Payer: Medicare Other | Admitting: *Deleted

## 2010-10-18 DIAGNOSIS — I6350 Cerebral infarction due to unspecified occlusion or stenosis of unspecified cerebral artery: Secondary | ICD-10-CM

## 2010-10-18 DIAGNOSIS — I4891 Unspecified atrial fibrillation: Secondary | ICD-10-CM

## 2010-10-18 LAB — POCT INR: INR: 3.2

## 2010-10-31 ENCOUNTER — Other Ambulatory Visit: Payer: Self-pay | Admitting: Internal Medicine

## 2010-10-31 ENCOUNTER — Encounter: Payer: Self-pay | Admitting: Cardiology

## 2010-11-01 ENCOUNTER — Ambulatory Visit (INDEPENDENT_AMBULATORY_CARE_PROVIDER_SITE_OTHER): Payer: Medicare Other | Admitting: *Deleted

## 2010-11-01 DIAGNOSIS — I4891 Unspecified atrial fibrillation: Secondary | ICD-10-CM

## 2010-11-01 DIAGNOSIS — I6350 Cerebral infarction due to unspecified occlusion or stenosis of unspecified cerebral artery: Secondary | ICD-10-CM

## 2010-11-01 LAB — POCT INR: INR: 2.3

## 2010-11-29 ENCOUNTER — Ambulatory Visit (INDEPENDENT_AMBULATORY_CARE_PROVIDER_SITE_OTHER): Payer: Medicare Other | Admitting: *Deleted

## 2010-11-29 DIAGNOSIS — I6350 Cerebral infarction due to unspecified occlusion or stenosis of unspecified cerebral artery: Secondary | ICD-10-CM

## 2010-11-29 DIAGNOSIS — I4891 Unspecified atrial fibrillation: Secondary | ICD-10-CM

## 2010-11-29 LAB — POCT INR: INR: 3

## 2010-12-26 ENCOUNTER — Other Ambulatory Visit: Payer: Self-pay | Admitting: Internal Medicine

## 2010-12-27 ENCOUNTER — Ambulatory Visit (INDEPENDENT_AMBULATORY_CARE_PROVIDER_SITE_OTHER): Payer: Medicare Other | Admitting: *Deleted

## 2010-12-27 DIAGNOSIS — I4891 Unspecified atrial fibrillation: Secondary | ICD-10-CM

## 2010-12-27 DIAGNOSIS — I6350 Cerebral infarction due to unspecified occlusion or stenosis of unspecified cerebral artery: Secondary | ICD-10-CM

## 2010-12-27 DIAGNOSIS — Z7901 Long term (current) use of anticoagulants: Secondary | ICD-10-CM

## 2010-12-27 DIAGNOSIS — Z23 Encounter for immunization: Secondary | ICD-10-CM

## 2010-12-27 LAB — POCT INR: INR: 4

## 2011-01-18 ENCOUNTER — Ambulatory Visit (INDEPENDENT_AMBULATORY_CARE_PROVIDER_SITE_OTHER): Payer: Medicare Other | Admitting: *Deleted

## 2011-01-18 DIAGNOSIS — I6350 Cerebral infarction due to unspecified occlusion or stenosis of unspecified cerebral artery: Secondary | ICD-10-CM

## 2011-01-18 DIAGNOSIS — Z7901 Long term (current) use of anticoagulants: Secondary | ICD-10-CM

## 2011-01-18 DIAGNOSIS — I4891 Unspecified atrial fibrillation: Secondary | ICD-10-CM

## 2011-01-29 ENCOUNTER — Other Ambulatory Visit: Payer: Self-pay | Admitting: Internal Medicine

## 2011-02-14 ENCOUNTER — Other Ambulatory Visit: Payer: Self-pay | Admitting: Internal Medicine

## 2011-02-15 ENCOUNTER — Ambulatory Visit (INDEPENDENT_AMBULATORY_CARE_PROVIDER_SITE_OTHER): Payer: Medicare Other | Admitting: *Deleted

## 2011-02-15 DIAGNOSIS — Z7901 Long term (current) use of anticoagulants: Secondary | ICD-10-CM

## 2011-02-15 DIAGNOSIS — I4891 Unspecified atrial fibrillation: Secondary | ICD-10-CM

## 2011-02-15 DIAGNOSIS — I6350 Cerebral infarction due to unspecified occlusion or stenosis of unspecified cerebral artery: Secondary | ICD-10-CM

## 2011-02-15 LAB — POCT INR: INR: 2.5

## 2011-03-16 ENCOUNTER — Ambulatory Visit (INDEPENDENT_AMBULATORY_CARE_PROVIDER_SITE_OTHER): Payer: Medicare Other | Admitting: *Deleted

## 2011-03-16 DIAGNOSIS — Z7901 Long term (current) use of anticoagulants: Secondary | ICD-10-CM

## 2011-03-16 DIAGNOSIS — I4891 Unspecified atrial fibrillation: Secondary | ICD-10-CM

## 2011-03-16 DIAGNOSIS — I6350 Cerebral infarction due to unspecified occlusion or stenosis of unspecified cerebral artery: Secondary | ICD-10-CM

## 2011-04-03 ENCOUNTER — Encounter: Payer: Self-pay | Admitting: Cardiology

## 2011-04-03 ENCOUNTER — Ambulatory Visit (INDEPENDENT_AMBULATORY_CARE_PROVIDER_SITE_OTHER): Payer: Medicare Other | Admitting: Cardiology

## 2011-04-03 VITALS — BP 165/60 | HR 62 | Ht 61.0 in | Wt 123.8 lb

## 2011-04-03 DIAGNOSIS — I4891 Unspecified atrial fibrillation: Secondary | ICD-10-CM

## 2011-04-03 DIAGNOSIS — I10 Essential (primary) hypertension: Secondary | ICD-10-CM

## 2011-04-03 NOTE — Progress Notes (Signed)
   HPI The patient returns for followup of her atrial fibrillation.  Since I last saw her she has done well. She denies any new palpitations, presyncope or syncope. She denies any chest pressure, neck or arm discomfort. She's not had any of the discomfort she had previously after eating [place] food. She has no new shortness of breath, PND or orthopnea.  Allergies  Allergen Reactions  . Hydroxychloroquine Sulfate     REACTION: rash    Current Outpatient Prescriptions  Medication Sig Dispense Refill  . Calcium Carbonate-Vitamin D 600-125 MG-UNIT TABS Take by mouth daily. Two tabs daily       . digoxin (LANOXIN) 0.125 MG tablet take 1/2 tablet by mouth once daily  15 tablet  5  . diltiazem (CARDIZEM CD) 240 MG 24 hr capsule take 1 capsule by mouth once daily  30 capsule  5  . folic acid (FOLVITE) 1 MG tablet Take 1 mg by mouth daily.        . hydrochlorothiazide 25 MG tablet Take 25 mg by mouth daily. 1/2 tablet once a day       . levothyroxine (SYNTHROID, LEVOTHROID) 75 MCG tablet take 1 tablet by mouth once daily  30 tablet  2  . methotrexate 2.5 MG tablet Take by mouth once a week.        . predniSONE (DELTASONE) 5 MG tablet Take 5 mg by mouth daily.        Marland Kitchen warfarin (COUMADIN) 5 MG tablet take by mouth as directed  30 tablet  2    Past Medical History  Diagnosis Date  . Unspecified cerebral artery occlusion with cerebral infarction   . Recurrent cold sores   . Xanthelasma of eyelid   . Ganglion cyst   . Anatomical narrow angle borderline glaucoma   . Hypothyroidism   . Hypertension   . Atrial fibrillation   . Sjogren's syndrome   . Goiter   . Rheumatoid arthritis   . Osteoporosis     Past Surgical History  Procedure Date  . Appendectomy   . Vaginal cyst removed 1998     ROS:  HOH.  Otherwise as stated in the HPI and negative for all other systems.  PHYSICAL EXAM BP 165/60  Pulse 62  Ht 5\' 1"  (1.549 m)  Wt 123 lb 12.8 oz (56.155 kg)  BMI 23.39 kg/m2 GENERAL:   Well appearing HEENT:  Pupils equal round and reactive, fundi not visualized, oral mucosa unremarkable NECK:  No jugular venous distention, waveform within normal limits, carotid upstroke brisk and symmetric, no bruits, no thyromegaly LYMPHATICS:  No cervical, inguinal adenopathy LUNGS:  Clear to auscultation bilaterally BACK:  No CVA tenderness CHEST:  Unremarkable HEART:  PMI not displaced or sustained,S1 and S2 within normal limits, no S3, no S4, no clicks, no rubs, no murmurs, irregular ABD:  Flat, positive bowel sounds normal in frequency in pitch, no bruits, no rebound, no guarding, no midline pulsatile mass, no hepatomegaly, no splenomegaly EXT:  2 plus upper pulses, diminished lower extremity appt.,  trace edema, no cyanosis no clubbing SKIN:  No rashes no nodules, chronic venous stasis changes NEURO:  Cranial nerves II through XII grossly intact, motor grossly intact throughout PSYCH:  Cognitively intact, oriented to person place and time  EKG:  Atrial fibrillation, left axis deviation, poor anterior R-wave progression, no acute ST-T wave changes. Rate 60s.  04/03/2011  ASSESSMENT AND PLAN

## 2011-04-03 NOTE — Assessment & Plan Note (Signed)
Blood pressure is slightly elevated but it has been better controlled. She's going to start keeping a blood pressure diary again. At this point no change in medications is indicated.

## 2011-04-03 NOTE — Assessment & Plan Note (Signed)
The patient  tolerates this rhythm and rate control and anticoagulation. We will continue with the meds as listed.  

## 2011-04-03 NOTE — Patient Instructions (Addendum)
Continue current medications as listed.  Follow up in 1 year with Dr Hochrein.  You will receive a letter in the mail 2 months before you are due.  Please call us when you receive this letter to schedule your follow up appointment.  

## 2011-04-05 ENCOUNTER — Other Ambulatory Visit: Payer: Self-pay | Admitting: Internal Medicine

## 2011-04-13 ENCOUNTER — Encounter: Payer: Medicare Other | Admitting: *Deleted

## 2011-04-16 ENCOUNTER — Ambulatory Visit (INDEPENDENT_AMBULATORY_CARE_PROVIDER_SITE_OTHER): Payer: Medicare Other | Admitting: Pharmacist

## 2011-04-16 DIAGNOSIS — I6350 Cerebral infarction due to unspecified occlusion or stenosis of unspecified cerebral artery: Secondary | ICD-10-CM

## 2011-04-16 DIAGNOSIS — I4891 Unspecified atrial fibrillation: Secondary | ICD-10-CM

## 2011-04-16 DIAGNOSIS — Z7901 Long term (current) use of anticoagulants: Secondary | ICD-10-CM

## 2011-05-07 ENCOUNTER — Other Ambulatory Visit: Payer: Self-pay | Admitting: Cardiology

## 2011-05-08 NOTE — Telephone Encounter (Signed)
..   Requested Prescriptions   Pending Prescriptions Disp Refills  . hydrochlorothiazide (HYDRODIURIL) 25 MG tablet [Pharmacy Med Name: HYDROCHLOROTHIAZIDE 25 MG TAB] 30 tablet 6    Sig: take 1/2 tablet by mouth once daily

## 2011-05-09 ENCOUNTER — Ambulatory Visit (INDEPENDENT_AMBULATORY_CARE_PROVIDER_SITE_OTHER): Payer: Medicare Other | Admitting: Pharmacist

## 2011-05-09 DIAGNOSIS — I6350 Cerebral infarction due to unspecified occlusion or stenosis of unspecified cerebral artery: Secondary | ICD-10-CM

## 2011-05-09 DIAGNOSIS — I4891 Unspecified atrial fibrillation: Secondary | ICD-10-CM

## 2011-05-09 DIAGNOSIS — Z7901 Long term (current) use of anticoagulants: Secondary | ICD-10-CM

## 2011-05-09 LAB — POCT INR: INR: 2.5

## 2011-06-06 ENCOUNTER — Ambulatory Visit (INDEPENDENT_AMBULATORY_CARE_PROVIDER_SITE_OTHER): Payer: Medicare Other | Admitting: *Deleted

## 2011-06-06 DIAGNOSIS — I4891 Unspecified atrial fibrillation: Secondary | ICD-10-CM

## 2011-06-06 DIAGNOSIS — I6350 Cerebral infarction due to unspecified occlusion or stenosis of unspecified cerebral artery: Secondary | ICD-10-CM

## 2011-06-06 DIAGNOSIS — Z7901 Long term (current) use of anticoagulants: Secondary | ICD-10-CM

## 2011-06-06 LAB — POCT INR: INR: 1.6

## 2011-06-12 ENCOUNTER — Other Ambulatory Visit: Payer: Self-pay | Admitting: Internal Medicine

## 2011-06-15 ENCOUNTER — Other Ambulatory Visit: Payer: Self-pay | Admitting: *Deleted

## 2011-06-15 MED ORDER — LEVOTHYROXINE SODIUM 75 MCG PO TABS: ORAL_TABLET | ORAL | Status: DC

## 2011-06-15 MED ORDER — WARFARIN SODIUM 5 MG PO TABS: ORAL_TABLET | ORAL | Status: DC

## 2011-06-18 NOTE — Telephone Encounter (Signed)
What Rx?

## 2011-06-20 ENCOUNTER — Ambulatory Visit (INDEPENDENT_AMBULATORY_CARE_PROVIDER_SITE_OTHER): Payer: Medicare Other | Admitting: *Deleted

## 2011-06-20 DIAGNOSIS — Z7901 Long term (current) use of anticoagulants: Secondary | ICD-10-CM

## 2011-06-20 DIAGNOSIS — I6350 Cerebral infarction due to unspecified occlusion or stenosis of unspecified cerebral artery: Secondary | ICD-10-CM

## 2011-06-20 DIAGNOSIS — I4891 Unspecified atrial fibrillation: Secondary | ICD-10-CM

## 2011-06-20 LAB — POCT INR: INR: 2.8

## 2011-06-26 ENCOUNTER — Encounter: Payer: Self-pay | Admitting: Internal Medicine

## 2011-06-26 ENCOUNTER — Ambulatory Visit (INDEPENDENT_AMBULATORY_CARE_PROVIDER_SITE_OTHER): Payer: Medicare Other | Admitting: Internal Medicine

## 2011-06-26 DIAGNOSIS — B029 Zoster without complications: Secondary | ICD-10-CM | POA: Insufficient documentation

## 2011-06-26 DIAGNOSIS — I4891 Unspecified atrial fibrillation: Secondary | ICD-10-CM

## 2011-06-26 DIAGNOSIS — M542 Cervicalgia: Secondary | ICD-10-CM

## 2011-06-26 MED ORDER — ACYCLOVIR 800 MG PO TABS: 800.0000 mg | ORAL_TABLET | Freq: Four times a day (QID) | ORAL | Status: AC

## 2011-06-26 MED ORDER — TRAMADOL HCL 50 MG PO TABS: 25.0000 mg | ORAL_TABLET | Freq: Three times a day (TID) | ORAL | Status: AC | PRN

## 2011-06-26 MED ORDER — TRIAMCINOLONE ACETONIDE 0.5 % EX CREA: TOPICAL_CREAM | Freq: Three times a day (TID) | CUTANEOUS | Status: DC

## 2011-06-26 NOTE — Assessment & Plan Note (Signed)
On coumadin 

## 2011-06-26 NOTE — Progress Notes (Signed)
  Subjective:    Patient ID: [name] [name], female    DOB: 1918-12-21, 78 y.o.   MRN: [id number]  HPI  C/o rash with itching and pain on the L of her neck; a new spot today. No CP, fever, HA  Review of Systems  Constitutional: Positive for chills. Negative for activity change, appetite change, fatigue and unexpected weight change.  HENT: Negative for congestion, sore throat, mouth sores and sinus pressure.   Eyes: Negative for visual disturbance.  Respiratory: Negative for cough and chest tightness.   Gastrointestinal: Negative for nausea and abdominal pain.  Genitourinary: Negative for frequency, difficulty urinating and vaginal pain.  Musculoskeletal: Positive for arthralgias. Negative for back pain and gait problem.  Skin: Positive for rash. Negative for pallor.  Neurological: Negative for dizziness, tremors, weakness, numbness and headaches.  Hematological: Negative for adenopathy. Does not bruise/bleed easily.  Psychiatric/Behavioral: Negative for confusion and sleep disturbance.       Objective:   Physical Exam  Constitutional: She appears well-developed. No distress.  HENT:  Head: Normocephalic.  Right Ear: External ear normal.  Left Ear: External ear normal.  Nose: Nose normal.  Mouth/Throat: Oropharynx is clear and moist.  Eyes: Conjunctivae are normal. Pupils are equal, round, and reactive to light. Right eye exhibits no discharge. Left eye exhibits no discharge.  Neck: Normal range of motion. Neck supple. No JVD present. No tracheal deviation present. No thyromegaly present.  Cardiovascular: Normal rate, regular rhythm and normal heart sounds.   Pulmonary/Chest: No stridor. No respiratory distress. She has no wheezes.  Abdominal: Soft. Bowel sounds are normal. She exhibits no distension and no mass. There is no tenderness. There is no rebound and no guarding.  Musculoskeletal: She exhibits tenderness (neck). She exhibits no edema.  Lymphadenopathy:    She has no  cervical adenopathy.  Neurological: She displays normal reflexes. No cranial nerve deficit. She exhibits normal muscle tone. Coordination normal.  Skin: Rash noted. No erythema.       eryth patches at the L neck base x 2 w/vesicles  Psychiatric: She has a normal mood and affect. Her behavior is normal. Judgment and thought content normal.          Assessment & Plan:

## 2011-06-26 NOTE — Assessment & Plan Note (Signed)
Depomedrol IM Tramadol w/cautiol

## 2011-06-26 NOTE — Assessment & Plan Note (Addendum)
L neck 4/13 (she had Zostavax in 2010). She is on MTX and Prednisone for her RA Depomedrol 120 mg IM Acyclovir x 10 d

## 2011-06-27 MED ORDER — METHYLPREDNISOLONE ACETATE 80 MG/ML IJ SUSP
120.0000 mg | Freq: Once | INTRAMUSCULAR | Status: AC
  Administered 2011-06-27: 120 mg via INTRAMUSCULAR

## 2011-06-27 NOTE — Progress Notes (Signed)
Addended by: Merrilyn Puma on: 06/27/2011 08:01 AM   Modules accepted: Orders

## 2011-07-12 ENCOUNTER — Encounter: Payer: Self-pay | Admitting: Internal Medicine

## 2011-07-12 ENCOUNTER — Ambulatory Visit (INDEPENDENT_AMBULATORY_CARE_PROVIDER_SITE_OTHER): Payer: Medicare Other | Admitting: Internal Medicine

## 2011-07-12 VITALS — BP 152/60 | HR 73 | Temp 97.4°F | Resp 16 | Wt 117.0 lb

## 2011-07-12 DIAGNOSIS — I10 Essential (primary) hypertension: Secondary | ICD-10-CM

## 2011-07-12 DIAGNOSIS — B029 Zoster without complications: Secondary | ICD-10-CM

## 2011-07-12 DIAGNOSIS — H8110 Benign paroxysmal vertigo, unspecified ear: Secondary | ICD-10-CM

## 2011-07-12 NOTE — Patient Instructions (Signed)
Labyrinthitis (Inner Ear Inflammation) Your exam shows you have an inner ear disturbance or labyrinthitis. The cause of this condition is not known. But it may be due to a virus infection. The symptoms of labyrinthitis include vertigo or dizziness made worse by motion, nausea and vomiting. The onset of labyrinthitis may be very sudden. It usually lasts for a few days and then clears up over 1-2 weeks. The treatment of an inner ear disturbance includes bed rest and medications to reduce dizziness, nausea, and vomiting. You should stay away from alcohol, tranquilizers, caffeine, nicotine, or any medicine your doctor thinks may make your symptoms worse. Further testing may be needed to evaluate your hearing and balance system. Please see your doctor or go to the emergency room right away if you have:  Increasing vertigo, earache, loss of hearing, or ear drainage.   Headache, blurred vision, trouble walking, fainting, or fever.   Persistent vomiting, dehydration, or extreme weakness.  Document Released: 02/26/2005 Document Revised: 02/15/2011 Document Reviewed: 08/14/2006 [facility] Patient Information 2012 [place], [place].   Benign Positional Vertigo Vertigo means you feel like you or your surroundings are moving when they are not. Benign positional vertigo is the most common form of vertigo. Benign means that the cause of your condition is not serious. Benign positional vertigo is more common in older adults. CAUSES   Benign positional vertigo is the result of an upset in the labyrinth system. This is an area in the middle ear that helps control your balance. This may be caused by a viral infection, head injury, or repetitive motion. However, often no specific cause is found. SYMPTOMS   Symptoms of benign positional vertigo occur when you move your head or eyes in different directions. Some of the symptoms may include:  Loss of balance and falls.   Vomiting.   Blurred vision.    Dizziness.   Nausea.   Involuntary eye movements (nystagmus).  DIAGNOSIS   Benign positional vertigo is usually diagnosed by physical exam. If the specific cause of your benign positional vertigo is unknown, your caregiver may perform imaging tests, such as magnetic resonance imaging (MRI) or computed tomography (CT). TREATMENT   Your caregiver may recommend movements or procedures to correct the benign positional vertigo. Medicines such as meclizine, benzodiazepines, and medicines for nausea may be used to treat your symptoms. In rare cases, if your symptoms are caused by certain conditions that affect the inner ear, you may need surgery. HOME CARE INSTRUCTIONS    Follow your caregiver's instructions.   Move slowly. Do not make sudden body or head movements.   Avoid driving.   Avoid operating heavy machinery.   Avoid performing any tasks that would be dangerous to you or others during a vertigo episode.   Drink enough fluids to keep your urine clear or pale yellow.  SEEK IMMEDIATE MEDICAL CARE IF:    You develop problems with walking, weakness, numbness, or using your arms, hands, or legs.   You have difficulty speaking.   You develop severe headaches.   Your nausea or vomiting continues or gets worse.   You develop visual changes.   Your family or friends notice any behavioral changes.   Your condition gets worse.   You have a fever.   You develop a stiff neck or sensitivity to light.  MAKE SURE YOU:    Understand these instructions.   Will watch your condition.   Will get help right away if you are not doing well or  get worse.  Document Released: 12/04/2005 Document Revised: 02/15/2011 Document Reviewed: 11/16/2010 [facility] Patient Information 2012 [place], [place].

## 2011-07-15 DIAGNOSIS — H8110 Benign paroxysmal vertigo, unspecified ear: Secondary | ICD-10-CM | POA: Insufficient documentation

## 2011-07-15 NOTE — Assessment & Plan Note (Signed)
Reviewed Dr. Loren Racer note. She was treated with acyclovir. She has minimal residual redness at the site of the rash on her posterior left neck. No significant pain.

## 2011-07-15 NOTE — Progress Notes (Signed)
  Subjective:    Patient ID: [name] [name], female    DOB: 06-14-1918, 78 y.o.   MRN: [id number]  HPI [name] [name] presents for follow-up of recent Zoster outbreak left posterior neck. She also presents with mild dizziness. Actually she has two types of dizziness: she has a sense of dysequilibrium with a staggering gait at times which is a mild inconvenience; she has a transient dizziness with position change. She is otherwise doing well.  Past Medical History  Diagnosis Date  . Unspecified cerebral artery occlusion with cerebral infarction   . Recurrent cold sores   . Xanthelasma of eyelid   . Ganglion cyst   . Anatomical narrow angle borderline glaucoma   . Hypothyroidism   . Hypertension   . Atrial fibrillation   . Sjogren's syndrome   . Goiter   . Rheumatoid arthritis   . Osteoporosis    Past Surgical History  Procedure Date  . Appendectomy   . Vaginal cyst removed 1998    No family history on file. History   Social History  . Marital Status: Single    Spouse Name: N/A    Number of Children: N/A  . Years of Education: N/A   Occupational History  . Not on file.   Social History Main Topics  . Smoking status: Never Smoker   . Smokeless tobacco: Not on file  . Alcohol Use: No  . Drug Use: No  . Sexually Active: Not Currently   Other Topics Concern  . Not on file   Social History Narrative   HSGMarried '43-29 years, widowed1 daughter- '56 adopted; 1 grandsonLives alone- I ADLSEnd of Life: still has packet at home, she has not made a decision (rediscussed 9/11)       Review of Systems System review is negative for any constitutional, cardiac, pulmonary, GI or neuro symptoms or complaints other than as described in the HPI.     Objective:   Physical Exam Filed Vitals:   07/12/11 1404  BP: 152/60  Pulse: 73  Temp: 97.4 F (36.3 C)  Resp: 16   Wt Readings from Last 3 Encounters:  07/12/11 117 lb (53.071 kg)  06/26/11 121 lb (54.885 kg)  04/03/11  123 lb 12.8 oz (56.155 kg)   Gen'l- WNWD well groomed and pleasant nonogenarian who looks younger than her age. HEENT - normal Cor- RR Pulm - normal respirations Neuro - A&O x 3, CN - normal facial symmetry and movement, EOMI, PERRLA. MS normal. Cerebellar - no tremor, negative Rhomberg, normal gait, no cogwheeling. Derm - feint macular erythemtous rash left posterior neck at the hairline. No vesicles.       Assessment & Plan:  Labyrinthitis - mild dysequilibrium with a normal neuro exam.  Plan  Meclizine 12.5 mg q 6 prn (may use otc bonine)

## 2011-07-15 NOTE — Assessment & Plan Note (Signed)
BP Readings from Last 3 Encounters:  07/12/11 152/60  06/26/11 132/70  04/03/11 165/60   subopitmal control but at 89 want to avoid over-treatment w/ concern for watershed events.  Plan Continue present medications.

## 2011-07-15 NOTE — Assessment & Plan Note (Signed)
Reviewed mechanism of carotid dysautonomia  Plan   rule of 20 with position change.

## 2011-07-18 ENCOUNTER — Ambulatory Visit (INDEPENDENT_AMBULATORY_CARE_PROVIDER_SITE_OTHER): Payer: Medicare Other | Admitting: Pharmacist

## 2011-07-18 DIAGNOSIS — I4891 Unspecified atrial fibrillation: Secondary | ICD-10-CM

## 2011-07-18 DIAGNOSIS — I6350 Cerebral infarction due to unspecified occlusion or stenosis of unspecified cerebral artery: Secondary | ICD-10-CM

## 2011-07-18 DIAGNOSIS — Z7901 Long term (current) use of anticoagulants: Secondary | ICD-10-CM

## 2011-07-18 LAB — POCT INR: INR: 3.9

## 2011-08-08 ENCOUNTER — Ambulatory Visit (INDEPENDENT_AMBULATORY_CARE_PROVIDER_SITE_OTHER): Payer: Medicare Other | Admitting: *Deleted

## 2011-08-08 DIAGNOSIS — Z7901 Long term (current) use of anticoagulants: Secondary | ICD-10-CM

## 2011-08-08 DIAGNOSIS — I6350 Cerebral infarction due to unspecified occlusion or stenosis of unspecified cerebral artery: Secondary | ICD-10-CM

## 2011-08-08 DIAGNOSIS — I4891 Unspecified atrial fibrillation: Secondary | ICD-10-CM

## 2011-08-15 ENCOUNTER — Ambulatory Visit (INDEPENDENT_AMBULATORY_CARE_PROVIDER_SITE_OTHER): Payer: Medicare Other | Admitting: Internal Medicine

## 2011-08-15 ENCOUNTER — Other Ambulatory Visit: Payer: Self-pay | Admitting: Internal Medicine

## 2011-08-15 ENCOUNTER — Ambulatory Visit (INDEPENDENT_AMBULATORY_CARE_PROVIDER_SITE_OTHER)
Admission: RE | Admit: 2011-08-15 | Discharge: 2011-08-15 | Disposition: A | Discharge status: Home / Self Care | Payer: Medicare Other | Source: Ambulatory Visit | Attending: Internal Medicine | Admitting: Internal Medicine

## 2011-08-15 ENCOUNTER — Encounter: Payer: Self-pay | Admitting: Internal Medicine

## 2011-08-15 VITALS — BP 142/52 | HR 87 | Temp 97.6°F | Resp 16

## 2011-08-15 DIAGNOSIS — M545 Low back pain: Secondary | ICD-10-CM | POA: Insufficient documentation

## 2011-08-15 DIAGNOSIS — M5126 Other intervertebral disc displacement, lumbar region: Secondary | ICD-10-CM

## 2011-08-15 DIAGNOSIS — M5116 Intervertebral disc disorders with radiculopathy, lumbar region: Secondary | ICD-10-CM

## 2011-08-15 DIAGNOSIS — M79605 Pain in left leg: Secondary | ICD-10-CM

## 2011-08-15 MED ORDER — OXYCODONE-ACETAMINOPHEN 7.5-325 MG PO TABS: 1.0000 | ORAL_TABLET | ORAL | Status: AC | PRN

## 2011-08-15 NOTE — Patient Instructions (Signed)
Back Pain, Adult Low back pain is very common. About 1 in 5 people have back pain.The cause of low back pain is rarely dangerous. The pain often gets better over time.About half of people with a sudden onset of back pain feel better in just 2 weeks. About 8 in 10 people feel better by 6 weeks.  CAUSES Some common causes of back pain include:  Strain of the muscles or ligaments supporting the spine.   Wear and tear (degeneration) of the spinal discs.   Arthritis.   Direct injury to the back.  DIAGNOSIS Most of the time, the direct cause of low back pain is not known.However, back pain can be treated effectively even when the exact cause of the pain is unknown.Answering your caregiver's questions about your overall health and symptoms is one of the most accurate ways to make sure the cause of your pain is not dangerous. If your caregiver needs more information, he or she may order lab work or imaging tests (X-rays or MRIs).However, even if imaging tests show changes in your back, this usually does not require surgery. HOME CARE INSTRUCTIONS For many people, back pain returns.Since low back pain is rarely dangerous, it is often a condition that people can learn to manageon their own.   Remain active. It is stressful on the back to sit or stand in one place. Do not sit, drive, or stand in one place for more than 30 minutes at a time. Take short walks on level surfaces as soon as pain allows.Try to increase the length of time you walk each day.   Do not stay in bed.Resting more than 1 or 2 days can delay your recovery.   Do not avoid exercise or work.Your body is made to move.It is not dangerous to be active, even though your back may hurt.Your back will likely heal faster if you return to being active before your pain is gone.   Pay attention to your body when you bend and lift. Many people have less discomfortwhen lifting if they bend their knees, keep the load close to their  bodies,and avoid twisting. Often, the most comfortable positions are those that put less stress on your recovering back.   Find a comfortable position to sleep. Use a firm mattress and lie on your side with your knees slightly bent. If you lie on your back, put a pillow under your knees.   Only take over-the-counter or prescription medicines as directed by your caregiver. Over-the-counter medicines to reduce pain and inflammation are often the most helpful.Your caregiver may prescribe muscle relaxant drugs.These medicines help dull your pain so you can more quickly return to your normal activities and healthy exercise.   Put ice on the injured area.   Put ice in a plastic bag.   Place a towel between your skin and the bag.   Leave the ice on for 15 to 20 minutes, 3 to 4 times a day for the first 2 to 3 days. After that, ice and heat may be alternated to reduce pain and spasms.   Ask your caregiver about trying back exercises and gentle massage. This may be of some benefit.   Avoid feeling anxious or stressed.Stress increases muscle tension and can worsen back pain.It is important to recognize when you are anxious or stressed and learn ways to manage it.Exercise is a great option.  SEEK MEDICAL CARE IF:  You have pain that is not relieved with rest or medicine.   You have   pain that does not improve in 1 week.   You have new symptoms.   You are generally not feeling well.  SEEK IMMEDIATE MEDICAL CARE IF:   You have pain that radiates from your back into your legs.   You develop new bowel or bladder control problems.   You have unusual weakness or numbness in your arms or legs.   You develop nausea or vomiting.   You develop abdominal pain.   You feel faint.  Document Released: 02/26/2005 Document Revised: 02/15/2011 Document Reviewed: 07/17/2010 [employer] Patient Information 2012 [employer]. 

## 2011-08-16 ENCOUNTER — Encounter: Payer: Self-pay | Admitting: Internal Medicine

## 2011-08-16 DIAGNOSIS — M5116 Intervertebral disc disorders with radiculopathy, lumbar region: Secondary | ICD-10-CM | POA: Insufficient documentation

## 2011-08-16 NOTE — Progress Notes (Signed)
Subjective:    Patient ID: [name] [name], female    DOB: [birth year], 78 y.o.   MRN: [id number]  Back Pain This is a new problem. The current episode started in the past 7 days. The problem occurs constantly. The problem has been gradually worsening since onset. The pain is present in the lumbar spine. The quality of the pain is described as shooting and stabbing. The pain radiates to the left thigh. The pain is at a severity of 7/10. The pain is severe. The pain is worse during the day. The symptoms are aggravated by bending and standing. Stiffness is present all day. Associated symptoms include leg pain (on the left). Pertinent negatives include no abdominal pain, bladder incontinence, bowel incontinence, chest pain, dysuria, fever, headaches, numbness, paresis, paresthesias, pelvic pain, perianal numbness, tingling, weakness or weight loss. Risk factors include history of osteoporosis, history of steroid use, lack of exercise and poor posture. She has tried nothing for the symptoms. The treatment provided no relief.      Review of Systems  Constitutional: Negative for fever, chills, weight loss, diaphoresis, activity change, appetite change, fatigue and unexpected weight change.  HENT: Negative.   Eyes: Negative.   Respiratory: Negative for apnea, cough, choking, chest tightness, shortness of breath, wheezing and stridor.   Cardiovascular: Negative for chest pain, palpitations and leg swelling.  Gastrointestinal: Negative for nausea, vomiting, abdominal pain, diarrhea, constipation, blood in stool and bowel incontinence.  Genitourinary: Negative.  Negative for bladder incontinence, dysuria and pelvic pain.  Musculoskeletal: Positive for back pain. Negative for myalgias, joint swelling, arthralgias and gait problem.  Skin: Negative for color change, pallor, rash and wound.  Neurological: Negative.  Negative for tingling, weakness, numbness, headaches and paresthesias.  Hematological:  Negative for adenopathy. Does not bruise/bleed easily.  Psychiatric/Behavioral: Negative.        Objective:   Physical Exam  Vitals reviewed. Constitutional: She is oriented to person, place, and time. She appears well-developed and well-nourished.  Non-toxic appearance. She does not have a sickly appearance. She does not appear ill. She appears distressed.       She is confined to a wheelchair - unable to stand or get on the exam table, she grimaces in pain with movement  HENT:  Head: Normocephalic and atraumatic.  Eyes: Conjunctivae are normal. Right eye exhibits no discharge. Left eye exhibits no discharge. No scleral icterus.  Neck: Normal range of motion. Neck supple. No JVD present. No tracheal deviation present. No thyromegaly present.  Cardiovascular: Normal rate, regular rhythm, normal heart sounds and intact distal pulses.  Exam reveals no gallop and no friction rub.   No murmur heard. Pulmonary/Chest: Effort normal and breath sounds normal. No stridor. No respiratory distress. She has no wheezes. She has no rales. She exhibits no tenderness.  Abdominal: Soft. Bowel sounds are normal. She exhibits no distension and no mass. There is no tenderness. There is no rebound and no guarding.  Musculoskeletal: Normal range of motion. She exhibits no edema and no tenderness.       Lumbar back: Normal. She exhibits normal range of motion, no tenderness, no bony tenderness, no swelling, no edema, no deformity, no laceration, no pain and no spasm.  Lymphadenopathy:    She has no cervical adenopathy.  Neurological: She is alert and oriented to person, place, and time. She displays no atrophy, no tremor and normal reflexes. No cranial nerve deficit or sensory deficit. She exhibits normal muscle tone. She displays no seizure activity. She displays no  Babinski's sign on the right side. She displays no Babinski's sign on the left side.  Reflex Scores:      Tricep reflexes are 1+ on the right side and  1+ on the left side.      Bicep reflexes are 1+ on the right side and 1+ on the left side.      Brachioradialis reflexes are 1+ on the right side and 1+ on the left side.      Patellar reflexes are 1+ on the right side and 1+ on the left side.      Achilles reflexes are 1+ on the right side and 1+ on the left side.      - SLR in BLE  Skin: Skin is warm and dry. No rash noted. She is not diaphoretic. No erythema. No pallor.  Psychiatric: She has a normal mood and affect. Her behavior is normal. Judgment and thought content normal.    Lab Results  Component Value Date   WBC 9.1 12/15/2009   HGB 14.0 12/15/2009   HCT 42.9 12/15/2009   PLT 255 12/15/2009   GLUCOSE 177* 12/15/2009   CHOL  Value: 169        ATP III CLASSIFICATION:  <200     mg/dL   Desirable  409-811  mg/dL   Borderline High  >=914    mg/dL   High        78/04/9560   TRIG 111 12/16/2009   HDL 55 12/16/2009   LDLCALC  Value: 92        Total Cholesterol/HDL:CHD Risk Coronary Heart Disease Risk Table                     Men   Women  1/2 Average Risk   3.4   3.3  Average Risk       5.0   4.4  2 X Average Risk   9.6   7.1  3 X Average Risk  23.4   11.0        Use the calculated Patient Ratio above and the CHD Risk Table to determine the patient's CHD Risk.        ATP III CLASSIFICATION (LDL):  <100     mg/dL   Optimal  130-865  mg/dL   Near or Above                    Optimal  130-159  mg/dL   Borderline  784-696  mg/dL   High  >295     mg/dL   Very High 28/06/1322   ALT 16 12/15/2009   AST 26 12/15/2009   NA 139 12/15/2009   K 5.0 SLIGHT HEMOLYSIS 12/15/2009   CL 106 12/15/2009   CREATININE 0.66 12/15/2009   BUN 10 12/15/2009   CO2 29 12/15/2009   TSH 2.11 01/27/2010   INR 3.8 08/08/2011   HGBA1C  Value: 6.4 (NOTE)                                                                       According to the ADA Clinical Practice Recommendations for 2011, when HbA1c is used as a screening test:   >=6.5%   Diagnostic of Diabetes Mellitus           (  if  abnormal result  is confirmed)  5.7-6.4%   Increased risk of developing Diabetes Mellitus  References:Diagnosis and Classification of Diabetes Mellitus,Diabetes Care,2011,34(Suppl 1):S62-S69 and Standards of Medical Care in         Diabetes - 2011,Diabetes Care,2011,34  (Suppl 1):S11-S61.* 12/15/2009  Dg Lumbar Spine Complete  08/15/2011  *RADIOLOGY REPORT*  Clinical Data: Left side back pain  LUMBAR SPINE - COMPLETE 4+ VIEW  Comparison: None.  Findings: Five views of the lumbar spine submitted.  Mild dextroscoliosis.  There is disc space flattening with vacuum disc phenomenon and mild anterior spurring at L3-L4 level.  Mild disc space flattening with mild anterior spurring at L2-L3 level.  Mild anterior spurring noted at T12-L1 level.  There is left lateral spurring at L2-L3 and L3-L4 level.  No acute fracture or subluxation.  Atherosclerotic calcifications of the abdominal aorta are noted.  IMPRESSION:  No acute fracture or subluxation.  Mild dextroscoliosis. Degenerative changes as described above.  Original Report Authenticated By: Natasha Mead, M.D.        Assessment & Plan:

## 2011-08-16 NOTE — Assessment & Plan Note (Signed)
I checked a plain film to look for vertebral fracture, none was found but there is sufficient ddd, spurring, flattening for me to think that she may have a disc herniation, nerve impingement, spinal stenosis to explain her symptoms. I will treat her back pain with percocet

## 2011-08-16 NOTE — Assessment & Plan Note (Signed)
I have asked her to get an MRI done to get a better picture of what is going on with her lower back

## 2011-08-17 ENCOUNTER — Telehealth: Payer: Self-pay

## 2011-08-17 MED ORDER — DIAZEPAM 5 MG PO TABS: 5.0000 mg | ORAL_TABLET | Freq: Two times a day (BID) | ORAL | Status: AC | PRN

## 2011-08-17 NOTE — Telephone Encounter (Signed)
Pt's daughter called requesting MD prescribe a Valium for pt to take prior to MRI that is scheduled for 06/11. Please advise.

## 2011-08-17 NOTE — Telephone Encounter (Signed)
Please call in the valium Rx 

## 2011-08-17 NOTE — Telephone Encounter (Signed)
Rx called in, daughter informed.

## 2011-08-18 ENCOUNTER — Ambulatory Visit
Admission: RE | Admit: 2011-08-18 | Discharge: 2011-08-18 | Disposition: A | Discharge status: Home / Self Care | Payer: Medicare Other | Source: Ambulatory Visit | Attending: Internal Medicine | Admitting: Internal Medicine

## 2011-08-18 DIAGNOSIS — M5116 Intervertebral disc disorders with radiculopathy, lumbar region: Secondary | ICD-10-CM

## 2011-08-18 DIAGNOSIS — M79605 Pain in left leg: Secondary | ICD-10-CM

## 2011-08-20 ENCOUNTER — Other Ambulatory Visit: Payer: Self-pay | Admitting: Internal Medicine

## 2011-08-20 DIAGNOSIS — M5116 Intervertebral disc disorders with radiculopathy, lumbar region: Secondary | ICD-10-CM

## 2011-08-20 DIAGNOSIS — M79605 Pain in left leg: Secondary | ICD-10-CM

## 2011-08-21 ENCOUNTER — Other Ambulatory Visit: Payer: Medicare Other

## 2011-08-27 ENCOUNTER — Ambulatory Visit (INDEPENDENT_AMBULATORY_CARE_PROVIDER_SITE_OTHER): Payer: Medicare Other | Admitting: Pharmacist

## 2011-08-27 DIAGNOSIS — I4891 Unspecified atrial fibrillation: Secondary | ICD-10-CM

## 2011-08-27 DIAGNOSIS — I6350 Cerebral infarction due to unspecified occlusion or stenosis of unspecified cerebral artery: Secondary | ICD-10-CM

## 2011-08-27 DIAGNOSIS — Z7901 Long term (current) use of anticoagulants: Secondary | ICD-10-CM

## 2011-08-27 LAB — POCT INR: INR: 2.2

## 2011-09-24 ENCOUNTER — Ambulatory Visit (INDEPENDENT_AMBULATORY_CARE_PROVIDER_SITE_OTHER): Payer: Medicare Other | Admitting: *Deleted

## 2011-09-24 DIAGNOSIS — I4891 Unspecified atrial fibrillation: Secondary | ICD-10-CM

## 2011-09-24 DIAGNOSIS — Z7901 Long term (current) use of anticoagulants: Secondary | ICD-10-CM

## 2011-09-24 DIAGNOSIS — I6350 Cerebral infarction due to unspecified occlusion or stenosis of unspecified cerebral artery: Secondary | ICD-10-CM

## 2011-09-24 LAB — POCT INR: INR: 2.3

## 2011-10-05 ENCOUNTER — Telehealth: Payer: Self-pay

## 2011-10-05 DIAGNOSIS — L600 Ingrowing nail: Secondary | ICD-10-CM

## 2011-10-05 NOTE — Telephone Encounter (Signed)
Pt's daughter called requesting a referral to Podiatry for ingrown toenails.

## 2011-10-07 NOTE — Telephone Encounter (Signed)
Order in to [facility] 

## 2011-10-17 ENCOUNTER — Other Ambulatory Visit: Payer: Self-pay | Admitting: Internal Medicine

## 2011-10-31 ENCOUNTER — Ambulatory Visit (INDEPENDENT_AMBULATORY_CARE_PROVIDER_SITE_OTHER): Payer: Medicare Other | Admitting: *Deleted

## 2011-10-31 ENCOUNTER — Telehealth: Payer: Self-pay | Admitting: *Deleted

## 2011-10-31 DIAGNOSIS — Z7901 Long term (current) use of anticoagulants: Secondary | ICD-10-CM

## 2011-10-31 DIAGNOSIS — I6350 Cerebral infarction due to unspecified occlusion or stenosis of unspecified cerebral artery: Secondary | ICD-10-CM

## 2011-10-31 DIAGNOSIS — I4891 Unspecified atrial fibrillation: Secondary | ICD-10-CM

## 2011-10-31 LAB — POCT INR: INR: 1.7

## 2011-10-31 NOTE — Telephone Encounter (Signed)
Message copied by Sharin Grave on Wed Oct 31, 2011  3:00 PM ------      Message from: MUSE, TIFFANY J      Created: Wed Oct 31, 2011  1:33 PM      Regarding: Bilateral lower extremity edema       Pt and daughter have noticed and it was observed today in [facility] that she is experiencing some edema (1+) in both lower legs. Edema noted on top of feet, ankle area and slightly above ankles. Pt take 12.5mg s of HCTZ daily. Please call her daughter, Sherryll Burger at [phone] with follow up.

## 2011-10-31 NOTE — Telephone Encounter (Signed)
Left message for daughter to call back.  

## 2011-11-01 NOTE — Telephone Encounter (Signed)
Fu call °Pt returning your call  °

## 2011-11-01 NOTE — Telephone Encounter (Signed)
Pt's dtr [name]  rtn call to Pam, pls call [phone]

## 2011-11-01 NOTE — Telephone Encounter (Signed)
Spoke with daughter who is concerned about edema in feet and legs bilaterally.  Daughter states pt has been eating bacon and not wearing her compression stockings.  She is not sure that she is always taking her HTCZ either because pt has mentioned she doesn't like to take it due/to increased urination.  Pt does not have complaints for SOB, no cough, no cp or any other s/s.  She is instructed to keep feet and legs elevated above the level of her heart, no bacon and to follow a 2 gm NA diet.  She is to wear her support stockings daily and may remove them at night.  Daughter will wt pt daily at the same time and notify us of a 2 lb wt gain over night and/or 5 lb in 1 week.  Daughter thanked me for my time and stated understanding of everything we discussed.

## 2011-11-02 ENCOUNTER — Telehealth: Payer: Self-pay | Admitting: Cardiology

## 2011-11-02 NOTE — Telephone Encounter (Signed)
Pt's daughter calling with many questions about mother's condition and mothers meds--evidentially mother is "fudging" about taking HCTZ and now has swelling in lower extremeties and mother is concerned about bruising from coumadin--advised to continue with ALL meds--encourage mom to elevate legs and wear support stockings, and take HCTZ--address briusing with [facility] clinic --daughter agrees and states will encourage mom to take meds

## 2011-11-02 NOTE — Telephone Encounter (Signed)
Patient daughter Netta Neat [phone] would like a return  To discuss pt care.

## 2011-11-05 ENCOUNTER — Telehealth: Payer: Self-pay | Admitting: Cardiology

## 2011-11-05 NOTE — Telephone Encounter (Signed)
FYI: FOR PAM, PT DOING WELL ON DIURETIC

## 2011-11-06 NOTE — Telephone Encounter (Signed)
Great!

## 2011-11-14 ENCOUNTER — Ambulatory Visit (INDEPENDENT_AMBULATORY_CARE_PROVIDER_SITE_OTHER): Payer: Medicare Other | Admitting: *Deleted

## 2011-11-14 DIAGNOSIS — Z7901 Long term (current) use of anticoagulants: Secondary | ICD-10-CM

## 2011-11-14 DIAGNOSIS — I4891 Unspecified atrial fibrillation: Secondary | ICD-10-CM

## 2011-11-14 DIAGNOSIS — I6350 Cerebral infarction due to unspecified occlusion or stenosis of unspecified cerebral artery: Secondary | ICD-10-CM

## 2011-11-17 ENCOUNTER — Emergency Department (HOSPITAL_COMMUNITY): Payer: Medicare Other

## 2011-11-17 ENCOUNTER — Other Ambulatory Visit: Payer: Self-pay

## 2011-11-17 ENCOUNTER — Encounter (HOSPITAL_COMMUNITY): Payer: Self-pay | Admitting: *Deleted

## 2011-11-17 ENCOUNTER — Emergency Department (HOSPITAL_COMMUNITY)
Admission: EM | Admit: 2011-11-17 | Discharge: 2011-11-17 | Disposition: A | Discharge status: Home / Self Care | Payer: Medicare Other | Attending: Emergency Medicine | Admitting: Emergency Medicine

## 2011-11-17 DIAGNOSIS — I10 Essential (primary) hypertension: Secondary | ICD-10-CM | POA: Insufficient documentation

## 2011-11-17 DIAGNOSIS — Y92009 Unspecified place in unspecified non-institutional (private) residence as the place of occurrence of the external cause: Secondary | ICD-10-CM | POA: Insufficient documentation

## 2011-11-17 DIAGNOSIS — E039 Hypothyroidism, unspecified: Secondary | ICD-10-CM | POA: Insufficient documentation

## 2011-11-17 DIAGNOSIS — Z9089 Acquired absence of other organs: Secondary | ICD-10-CM | POA: Insufficient documentation

## 2011-11-17 DIAGNOSIS — S81009A Unspecified open wound, unspecified knee, initial encounter: Secondary | ICD-10-CM | POA: Insufficient documentation

## 2011-11-17 DIAGNOSIS — M533 Sacrococcygeal disorders, not elsewhere classified: Secondary | ICD-10-CM | POA: Insufficient documentation

## 2011-11-17 DIAGNOSIS — M069 Rheumatoid arthritis, unspecified: Secondary | ICD-10-CM | POA: Insufficient documentation

## 2011-11-17 DIAGNOSIS — S91012A Laceration without foreign body, left ankle, initial encounter: Secondary | ICD-10-CM

## 2011-11-17 DIAGNOSIS — M810 Age-related osteoporosis without current pathological fracture: Secondary | ICD-10-CM | POA: Insufficient documentation

## 2011-11-17 DIAGNOSIS — I4891 Unspecified atrial fibrillation: Secondary | ICD-10-CM | POA: Insufficient documentation

## 2011-11-17 DIAGNOSIS — Z79899 Other long term (current) drug therapy: Secondary | ICD-10-CM | POA: Insufficient documentation

## 2011-11-17 DIAGNOSIS — W19XXXA Unspecified fall, initial encounter: Secondary | ICD-10-CM | POA: Insufficient documentation

## 2011-11-17 LAB — URINALYSIS, MICROSCOPIC ONLY
Glucose, UA: NEGATIVE mg/dL
Ketones, ur: NEGATIVE mg/dL
Protein, ur: NEGATIVE mg/dL

## 2011-11-17 LAB — BASIC METABOLIC PANEL
CO2: 33 mEq/L — ABNORMAL HIGH (ref 19–32)
Calcium: 9.4 mg/dL (ref 8.4–10.5)
Creatinine, Ser: 0.61 mg/dL (ref 0.50–1.10)

## 2011-11-17 LAB — CBC
MCH: 33 pg (ref 26.0–34.0)
MCV: 98.6 fL (ref 78.0–100.0)
Platelets: 263 10*3/uL (ref 150–400)
RBC: 4.39 MIL/uL (ref 3.87–5.11)

## 2011-11-17 LAB — DIGOXIN LEVEL: Digoxin Level: 0.5 ng/mL — ABNORMAL LOW (ref 0.8–2.0)

## 2011-11-17 LAB — PROTIME-INR
INR: 2.54 — ABNORMAL HIGH (ref 0.00–1.49)
Prothrombin Time: 27.8 seconds — ABNORMAL HIGH (ref 11.6–15.2)

## 2011-11-17 NOTE — ED Notes (Signed)
Pt. Has c/o fall in her bathroom and HTN.  Pt. Has had a stroke in the past but was negative with EMS stroke scale.  Pt. Has no c/o pain at this time. Pt. Is heard of hearing and you will need to speak up to talk to her.  Pt. Has a skin tear to left elbow and left ankle.

## 2011-11-17 NOTE — ED Provider Notes (Signed)
History     CSN: [id number]  Arrival date & time 11/17/11  [id number]   First MD Initiated Contact with Patient 11/17/11 0930      Chief Complaint  Patient presents with  . Fall  . Hypertension  . Laceration    The history is provided by the patient.   the patient reports today she was walking into the bathroom and became slightly weak and fell down onto her bottom.  She reports pain in her coccyx and laceration on her left lateral ankle.  She did not hit head.  She has no headache.  She denies neck pain.  She is on Coumadin for history of atrial fibrillation.  She denies pain in her knees her hips.  She denies abdominal pain.  She had no preceding chest pain or palpitations.  She does report she has such bad knee pain that made it difficult for her to get off the ground and that she scooted to a phone and called her family.  PERRLA complaint this time is coccygeal pain and laceration.  Family reports is at baseline mental status.  No recent fevers or chills.  No recent illness.  Past Medical History  Diagnosis Date  . Unspecified cerebral artery occlusion with cerebral infarction   . Recurrent cold sores   . Xanthelasma of eyelid   . Ganglion cyst   . Anatomical narrow angle borderline glaucoma   . Hypothyroidism   . Hypertension   . Atrial fibrillation   . Sjogren's syndrome   . Goiter   . Rheumatoid arthritis   . Osteoporosis     Past Surgical History  Procedure Date  . Appendectomy   . Vaginal cyst removed 1998     History reviewed. No pertinent family history.  History  Substance Use Topics  . Smoking status: Never Smoker   . Smokeless tobacco: Not on file  . Alcohol Use: No    OB History    Grav Para Term Preterm Abortions TAB SAB Ect Mult Living                  Review of Systems  All other systems reviewed and are negative.    Allergies  Hydroxychloroquine sulfate  Home Medications   Current Outpatient Rx  Name Route Sig Dispense Refill  . DIGOXIN  0.125 MG PO TABS  take 1/2 tablet by mouth once daily 15 tablet 5  . DILTIAZEM HCL ER COATED BEADS 240 MG PO CP24  take 1 capsule by mouth once daily 30 capsule 5  . FOLIC ACID 1 MG PO TABS Oral Take 1 mg by mouth daily.      Marland Kitchen HYDROCHLOROTHIAZIDE 25 MG PO TABS  take 1/2 tablet by mouth once daily 30 tablet 6  . LEVOTHYROXINE SODIUM 75 MCG PO TABS  take 1 tablet by mouth once daily 30 tablet 5  . METHOTREXATE SODIUM 2.5 MG PO TABS Oral Take 15 mg by mouth once a week. Takes on Wednesday    . PREDNISONE 5 MG PO TABS Oral Take 5 mg by mouth daily.      . WARFARIN SODIUM 2.5 MG PO TABS Oral Take 2.5 mg by mouth daily.      BP 157/44  Temp 98 F (36.7 C) (Oral)  Resp 18  SpO2 95%  Physical Exam  Nursing note and vitals reviewed. Constitutional: She is oriented to person, place, and time. She appears well-developed and well-nourished. No distress.  HENT:  Head: Normocephalic and atraumatic.  Eyes:  EOM are normal.  Neck: Normal range of motion.  Cardiovascular: Normal rate, regular rhythm and normal heart sounds.   Pulmonary/Chest: Effort normal and breath sounds normal.  Abdominal: Soft. She exhibits no distension. There is no tenderness.  Musculoskeletal: Normal range of motion.       Full range of motion bilateral hips knees and ankles.  Full range of motion of bilateral wrists elbows and shoulders.  Small abrasion to left posterior elbow.  Small laceration to left lateral ankle overlying the lateral malleolus.  Small amount of bleeding noted from the left ankle.  She does have tenderness overlying her coccyx and her lumbar spine.  She has no lumbar step-offs.  Neurological: She is alert and oriented to person, place, and time.  Skin: Skin is warm and dry.  Psychiatric: She has a normal mood and affect. Judgment normal.    ED Course  Procedures (including critical care time)  LACERATION REPAIR Performed by: Lyanne Co Consent: Verbal consent obtained. Risks and benefits:  risks, benefits and alternatives were discussed Patient identity confirmed: provided demographic data Time out performed prior to procedure Prepped and Draped in normal sterile fashion Wound explored Laceration Location: Left lateral ankle Laceration Length: 2cm No Foreign Bodies seen or palpated Anesthesia: none Irrigation method: syringe Amount of cleaning: standard Skin closure: steristrips and dermabond Patient tolerance: Patient tolerated the procedure well with no immediate complications.   Labs Reviewed  CBC - Abnormal; Notable for the following:    WBC 11.5 (*)     RDW 15.7 (*)     All other components within normal limits  BASIC METABOLIC PANEL - Abnormal; Notable for the following:    CO2 33 (*)     Glucose, Bld 121 (*)     GFR calc non Af Amer 77 (*)     GFR calc Af Amer 89 (*)     All other components within normal limits  URINALYSIS, WITH MICROSCOPIC - Abnormal; Notable for the following:    Hgb urine dipstick TRACE (*)     Leukocytes, UA TRACE (*)     All other components within normal limits  PROTIME-INR - Abnormal; Notable for the following:    Prothrombin Time 27.8 (*)     INR 2.54 (*)     All other components within normal limits  DIGOXIN LEVEL - Abnormal; Notable for the following:    Digoxin Level 0.5 (*)     All other components within normal limits   Dg Lumbar Spine Complete  11/17/2011  *RADIOLOGY REPORT*  Clinical Data: Post fall, now with back pain  LUMBAR SPINE - COMPLETE 4+ VIEW  Comparison: Lumbar spine MRI - 08/18/2011; lumbar spine radiographs - 08/15/2011  Findings:  There are five non-rib bearing lumbar type vertebral bodies.  Mild scoliotic curvature of the thoracolumbar spine, convex to the right.  The provided lateral radiograph is degraded secondary to obliquity. No definite anterolisthesis or retrolisthesis.  No definite pars defects.  There is grossly unchanged mild cavity of the superior endplate of the L1 and L2 vertebral bodies.   Remaining intervertebral body heights are preserved.  Redemonstrated mild to moderate multilevel DDD, worst at L2 - L3 and L3 - L4.  Vascular calcifications within the abdominal aorta.  Regional bowel gas pattern and soft tissues are normal.  IMPRESSION: Grossly unchanged mild to moderate multilevel DDD without definite acute findings.   Original Report Authenticated By: Waynard Reeds, M.D.    Dg Pelvis 1-2 Views  11/17/2011  *RADIOLOGY  REPORT*  Clinical Data: Post fall, now with low back pain  PELVIS - 1-2 VIEW  Comparison: Lumbar spine radiographs - earlier same day  Findings:  Osteopenia without definite fracture.  Limited visualization of the lumbar spine suggests degenerative change.  Mild degenerative change of the bilateral hips is suspected.  Regional soft tissues are normal.  No radiopaque foreign body.  IMPRESSION: Osteopenia without definite fracture.   Original Report Authenticated By: Waynard Reeds, M.D.    Dg Ankle Complete Left  11/17/2011  *RADIOLOGY REPORT*  Clinical Data: Post fall, now with laceration to lateral ankle.  LEFT ANKLE COMPLETE - 3+ VIEW  Comparison: None.  Findings: No fracture or dislocation.  Gauze material overlies the lateral aspect of the ankle.  Ankle mortise appears preserved. Mild degenerative change of the medial malleolus and talus.  No ankle joint effusion.  Minimal enthesopathic change of the Achilles tendon insertion site. No radiopaque foreign body.  IMPRESSION: No fracture or radiopaque foreign body.   Original Report Authenticated By: Waynard Reeds, M.D.      1. Fall   2. Coccyx pain   3. Laceration of left ankle       MDM  Laceration repaired with Dermabond and Steri-Strips.  The patient is able to ambulate in the emergency room without a difficulty.  I suspect this is a Curator fall.  Patient does have coccygeal pain.  I think this can be treated as outpatient.        Lyanne Co, MD 11/17/11 [phone]

## 2011-11-17 NOTE — ED Notes (Signed)
Ambulated in the hallway with minimal assistance, tolerated well

## 2011-11-19 ENCOUNTER — Ambulatory Visit (INDEPENDENT_AMBULATORY_CARE_PROVIDER_SITE_OTHER): Payer: Medicare Other | Admitting: Internal Medicine

## 2011-11-19 ENCOUNTER — Telehealth: Payer: Self-pay | Admitting: Internal Medicine

## 2011-11-19 VITALS — BP 148/54 | HR 82 | Temp 97.6°F | Resp 18 | Wt 115.4 lb

## 2011-11-19 DIAGNOSIS — I10 Essential (primary) hypertension: Secondary | ICD-10-CM

## 2011-11-19 DIAGNOSIS — S81009A Unspecified open wound, unspecified knee, initial encounter: Secondary | ICD-10-CM

## 2011-11-19 DIAGNOSIS — S41109A Unspecified open wound of unspecified upper arm, initial encounter: Secondary | ICD-10-CM

## 2011-11-19 DIAGNOSIS — S81809A Unspecified open wound, unspecified lower leg, initial encounter: Secondary | ICD-10-CM

## 2011-11-19 DIAGNOSIS — S41112A Laceration without foreign body of left upper arm, initial encounter: Secondary | ICD-10-CM

## 2011-11-19 DIAGNOSIS — S91019A Laceration without foreign body, unspecified ankle, initial encounter: Secondary | ICD-10-CM

## 2011-11-19 MED ORDER — TRAMADOL HCL 50 MG PO TABS: 50.0000 mg | ORAL_TABLET | Freq: Three times a day (TID) | ORAL | Status: AC | PRN

## 2011-11-19 NOTE — Telephone Encounter (Signed)
The patient's daughter called very upset about her mother falling yesterday.  She was taken to the emergency room and they treated an ankle laceration and reported the fall was due to high blood pressure.  The daughter is insistent that she comes in with the pt today to follow up on the incident.  She was offered an appointment tomorrow, but refused.  Please advise if you want the patient worked in.  Thanks!    [phone]

## 2011-11-19 NOTE — Progress Notes (Signed)
Subjective:     Patient ID: [name] [name], female   DOB: 01-29-1919, 78 y.o.   MRN: [id number]  HPI Comments: [name] [name] is a 78 yo female with a history of spinal stenosis and currently on coumadin therapy for her atrial fibrillation who has come to clinic today with her daughter for follow up on an ER visit for a fall. This is her second fall this year. PT states that when she fell she hit a bench in her bathroom with her left side then the floor. She did not hit her head. En route the the ER, the EMTs reported asystolic blood pressure of 226. The EMT on scene told the daughter and patient that she fell because of her elevated blood pressure and prompted them to come to clinic today for evaluation. This blood pressure was out of her normal range. Her highest systolic BP is normally 170.She cannot recall any symptoms prior to falling. Her daughter reports that she did not experience any facial droop or muscle weakness prior to fall. Patient's daughter has stated that the patient has a history of stroke.   Past Medical History  Diagnosis Date  . Unspecified cerebral artery occlusion with cerebral infarction   . Recurrent cold sores   . Xanthelasma of eyelid   . Ganglion cyst   . Anatomical narrow angle borderline glaucoma   . Hypothyroidism   . Hypertension   . Atrial fibrillation   . Sjogren's syndrome   . Goiter   . Rheumatoid arthritis   . Osteoporosis    Past Surgical History  Procedure Date  . Appendectomy   . Vaginal cyst removed 1998    No family history on file. History   Social History  . Marital Status: Single    Spouse Name: N/A    Number of Children: N/A  . Years of Education: N/A   Occupational History  . Not on file.   Social History Main Topics  . Smoking status: Never Smoker   . Smokeless tobacco: Not on file  . Alcohol Use: No  . Drug Use: No  . Sexually Active: Not Currently   Other Topics Concern  . Not on file   Social History Narrative   HSGMarried '43-29 years, widowed1 daughter- '56 adopted; 1 grandsonLives alone- I ADLSEnd of Life: still has packet at home, she has not made a decision (rediscussed 9/11)    Current Outpatient Prescriptions on File Prior to Visit  Medication Sig Dispense Refill  . digoxin (LANOXIN) 0.125 MG tablet take 1/2 tablet by mouth once daily  15 tablet  5  . diltiazem (CARDIZEM CD) 240 MG 24 hr capsule take 1 capsule by mouth once daily  30 capsule  5  . folic acid (FOLVITE) 1 MG tablet Take 1 mg by mouth daily.        . hydrochlorothiazide (HYDRODIURIL) 25 MG tablet take 1/2 tablet by mouth once daily  30 tablet  6  . levothyroxine (SYNTHROID, LEVOTHROID) 75 MCG tablet take 1 tablet by mouth once daily  30 tablet  5  . predniSONE (DELTASONE) 5 MG tablet Take 5 mg by mouth daily.        Marland Kitchen warfarin (COUMADIN) 2.5 MG tablet Take 2.5 mg by mouth daily.      . methotrexate 2.5 MG tablet Take 15 mg by mouth once a week. Takes on Wednesday         Review of Systems  All other systems reviewed and are negative.  Objective:   Physical Exam  Constitutional: No distress.  HENT:  Head: Atraumatic.  Right Ear: External ear normal.  Left Ear: External ear normal.  Mouth/Throat: Oropharynx is clear and moist. No oropharyngeal exudate.  Eyes: Conjunctivae are normal. Pupils are equal, round, and reactive to light. Right eye exhibits no discharge. Left eye exhibits no discharge. No scleral icterus.  Neck: Normal range of motion. Neck supple. No tracheal deviation present. No thyromegaly present.  Cardiovascular: Normal rate, normal heart sounds and intact distal pulses.  Exam reveals no gallop and no friction rub.   No murmur heard.      Irregular heart rate.  Pulmonary/Chest: Effort normal and breath sounds normal. No respiratory distress. She has no wheezes. She has no rales.  Abdominal: Soft. She exhibits no distension. There is no tenderness.  Musculoskeletal: Normal range of motion.        Left ankle: She exhibits swelling, ecchymosis and laceration. She exhibits no deformity and normal pulse.       Lumbar back: She exhibits tenderness (tender to palpation between L3 and L4). She exhibits no deformity.       Back:       Arms:      Feet:  Lymphadenopathy:    She has no cervical adenopathy.  Neurological: She is alert.       No facial droop.  Skin: Skin is dry. She is not diaphoretic.  Psychiatric: Her behavior is normal. Judgment and thought content normal.   Filed Vitals:   11/19/11 1702  BP: 148/54  Pulse: 82  Temp: 97.6 F (36.4 C)  Resp: 18       Assessment:     1. Laceration on elbow and ankle - Appear to have stopped bleeding and do not show signs of infection.  PLAN -  Patient should remain on coumadin and keep the mount dressed. Patient was advised  to look for signs of infection in the wound area. She will follow up again on 11/22/2011 for a  re-evaluation. 2. Elevated blood pressure - Patient's elevated blood pressure appears to be an isolated event brought on by the stress of falling.  PLAN - Advise patient to monitor blood pressure at home and bring a log to her next visit on  11/22/2011. 3. Fall - Patient's reason for falling is possibly related to her spinal stenosis.  PLAN - re-evaluate on 11/22/2011.      Attending note: patient interviewed and examined. Agree with assessment and plan as outlined by Mr. Kai Levins, MSIII. Did review BP readings on file back to 2011 - an unchanged pattern of BP. Her acute hypertension was most likely related to pain and injury  For pain control she is advised that if she takes anything she should start with APAP 500-1,000 mg tid on schedule and may use Tramadol 50 mg q8 if needed.   Her daughter will stay with her for the next several days.

## 2011-11-19 NOTE — Patient Instructions (Addendum)
Ankle wound - looks ok today. Will need to return for a wound check Thursday. Call if it gets hot to touch, pussy, or if you develop a fever.   Blood pressure - pattern of good control and I would make no change in your medication. It is very unlikely that high blood pressure made you fall!  Pain control - if you choose to take anything: tylenol 500 or 1,000 mg three times a day on schedule.   As a back up for uncontrolled pain you can take tramadol, a non-narcotic 50 mg every 8 hours.

## 2011-11-22 ENCOUNTER — Ambulatory Visit (INDEPENDENT_AMBULATORY_CARE_PROVIDER_SITE_OTHER): Payer: Medicare Other | Admitting: Internal Medicine

## 2011-11-22 ENCOUNTER — Encounter: Payer: Self-pay | Admitting: Internal Medicine

## 2011-11-22 VITALS — BP 142/60 | HR 88 | Temp 98.2°F | Resp 16 | Wt 114.0 lb

## 2011-11-22 DIAGNOSIS — Z23 Encounter for immunization: Secondary | ICD-10-CM

## 2011-11-22 DIAGNOSIS — IMO0001 Reserved for inherently not codable concepts without codable children: Secondary | ICD-10-CM

## 2011-11-22 DIAGNOSIS — Z4800 Encounter for change or removal of nonsurgical wound dressing: Secondary | ICD-10-CM

## 2011-11-22 MED ORDER — DIGOXIN 125 MCG PO TABS: 0.1250 mg | ORAL_TABLET | Freq: Every day | ORAL | Status: DC

## 2011-11-22 MED ORDER — LEVOTHYROXINE SODIUM 75 MCG PO TABS: ORAL_TABLET | ORAL | Status: DC

## 2011-11-25 NOTE — Progress Notes (Signed)
  Subjective:    Patient ID: [name] [name], female    DOB: [birth year], 78 y.o.   MRN: [id number]  HPI [name] [name] presents for wound check left ankle. In the interval since last visit she has had some drainage from the wound from underneath the opsite dressing and she has continued pain. Her UE wound has done fine. She has not had any fever or chills. Although the long durability of opsite dressing was explained whe would like the dressing removed for wound inspection.  PMH, FamHx and SocHx reviewed for any changes and relevance.  No change in medications   Review of Systems System review is negative for any constitutional, cardiac, pulmonary, GI or neuro symptoms or complaints other than as described in the HPI. She has had no fever or chills or increase in pain     Objective:   Physical Exam Filed Vitals:   11/22/11 1433  BP: 142/60  Pulse: 88  Temp: 98.2 F (36.8 C)  Resp: 16   Gen'l- very elderly white woman in no distress Wound - "opsite" removed carefully from wound left ankle. There is a devitalized native skin flap over the wound. The original steri-strip came away. There is a small amount of sero-sanguinous drainage but no frank purulent drainage, no surrounding calore or rubor. The area is tender to touch.      Assessment & Plan:  Wound left ankle - on inspection there is no frank infection. The wound was redressed using 1/4" steristrips across the skin flap, covered with a tefla dressing secured in place with an elastic bandage.  Plan-  return in 5-7 days for follow-up wound check.  Call for purulent drainage, fever or increasing pain.

## 2011-11-26 ENCOUNTER — Telehealth: Payer: Self-pay | Admitting: Internal Medicine

## 2011-11-26 NOTE — Telephone Encounter (Signed)
Patients daughter would like to speak with Fannie Knee about why her mothers heart medication was changed to a full tablet

## 2011-11-28 ENCOUNTER — Ambulatory Visit (INDEPENDENT_AMBULATORY_CARE_PROVIDER_SITE_OTHER): Payer: Medicare Other | Admitting: Internal Medicine

## 2011-11-28 ENCOUNTER — Encounter: Payer: Self-pay | Admitting: Internal Medicine

## 2011-11-28 VITALS — BP 152/60 | HR 87 | Temp 98.0°F | Resp 16 | Wt 114.0 lb

## 2011-11-28 DIAGNOSIS — S81819A Laceration without foreign body, unspecified lower leg, initial encounter: Secondary | ICD-10-CM

## 2011-11-28 DIAGNOSIS — S81009A Unspecified open wound, unspecified knee, initial encounter: Secondary | ICD-10-CM

## 2011-11-28 NOTE — Patient Instructions (Signed)
1. Elbow and Leg Wounds - Neither wound looks infected. The clear drainage you see is serosanguinous fluid and it is a normal part of wound healing. Your healing will still take time, and it is progressing well. We have redressed your leg wound with a Tegaderm dressing. You do not have to use the ACE wrap bandage anymore. You should follow up again in 1 week so that we can change your dressing and make sure it is still not infected and healing well.

## 2011-11-28 NOTE — Telephone Encounter (Signed)
SPOKE WITH DAUGHTER AND MEDICATION ORDER AND DOSE CORRECTED IN OUR SYSTEM TO 1/2 TABLET

## 2011-11-28 NOTE — Progress Notes (Signed)
Subjective:     Patient ID: [name] [name], female   DOB: 1918/07/04, 78 y.o.   MRN: [id number]  HPI Comments: [name] [name] is a 78 yo female with multiple medical problems who is here today for her third follow up for a fall that happened on 11/17/2011. She continues to report no fever or chills. She has no complaints about the laceration on her leg or left elbow. She reports that she is using her ace bandage over the leg wound and adjusting the tightness at night. She reports no purulent drainage from her leg wound or pain. She has not taken the bandage on her leg off for a week. Her daughter changes the OP Site on her elbow once a week. She continues to take her coumadin and reports no profuse bleeding episodes.  Past Medical History  Diagnosis Date  . Unspecified cerebral artery occlusion with cerebral infarction   . Recurrent cold sores   . Xanthelasma of eyelid   . Ganglion cyst   . Anatomical narrow angle borderline glaucoma   . Hypothyroidism   . Hypertension   . Atrial fibrillation   . Sjogren's syndrome   . Goiter   . Rheumatoid arthritis   . Osteoporosis    Past Surgical History  Procedure Date  . Appendectomy   . Vaginal cyst removed 1998    No family history on file. History   Social History  . Marital Status: Single    Spouse Name: N/A    Number of Children: N/A  . Years of Education: N/A   Occupational History  . Not on file.   Social History Main Topics  . Smoking status: Never Smoker   . Smokeless tobacco: Not on file  . Alcohol Use: No  . Drug Use: No  . Sexually Active: Not Currently   Other Topics Concern  . Not on file   Social History Narrative   HSGMarried '43-29 years, widowed1 daughter- '56 adopted; 1 grandsonLives alone- I ADLSEnd of Life: still has packet at home, she has not made a decision (rediscussed 9/11)    Current Outpatient Prescriptions on File Prior to Visit  Medication Sig Dispense Refill  . diltiazem (CARDIZEM CD) 240 MG 24 hr  capsule take 1 capsule by mouth once daily  30 capsule  5  . folic acid (FOLVITE) 1 MG tablet Take 1 mg by mouth daily.        . hydrochlorothiazide (HYDRODIURIL) 25 MG tablet take 1/2 tablet by mouth once daily  30 tablet  6  . levothyroxine (SYNTHROID, LEVOTHROID) 75 MCG tablet take 1 tablet by mouth once daily  90 tablet  3  . methotrexate 2.5 MG tablet Take 15 mg by mouth once a week. Takes on Wednesday      . predniSONE (DELTASONE) 5 MG tablet Take 5 mg by mouth daily.        . traMADol (ULTRAM) 50 MG tablet Take 1 tablet (50 mg total) by mouth every 8 (eight) hours as needed for pain.  30 tablet  1  . warfarin (COUMADIN) 2.5 MG tablet Take 2.5 mg by mouth daily.      Marland Kitchen DISCONTD: digoxin (LANOXIN) 0.125 MG tablet Take 1 tablet (0.125 mg total) by mouth daily.  90 tablet  3    Review of Systems  All other systems reviewed and are negative.       Objective:   Physical Exam  Nursing note and vitals reviewed. Constitutional: She is oriented to person, place, and  time. No distress.       Patient was using a walker today.  Pulmonary/Chest: Effort normal.  Musculoskeletal:       2+ pitting edema in left leg. Pain on palpation of legs bl. No pain over left or right tibia on palpation. Full ROM about ankle. Full ROM in left leg. Patient able to raise leg on her own against gravity.   Neurological: She is alert and oriented to person, place, and time.  Skin: Skin is warm. She is not diaphoretic.       Laceration on her left leg appears to be healing well. There is some clear fluid oozing from the wound but no signs of infection. Left elbow wound shows no sign of infection and is healing.  Psychiatric: She has a normal mood and affect. Her behavior is normal. Judgment and thought content normal.   Filed Vitals:   11/28/11 1417  BP: 152/60  Pulse: 87  Temp: 98 F (36.7 C)  Resp: 16        Assessment & Plan:     1. Wound on Left Leg - Healing well. No signs of infection  PLAN - No  need for treatment at this time. Re-bandage wound and have patient monitor healing  progress at home. Have patient f/u in 1 week. 2. Wound On Left Elbow - Healing with no signs of infection.  PLAN - No need for treatment at this time. Continue to have daughter change dressing once a  week.     Attending note: patient interviewed and examined. Her wound was dressed last with steri-strips to hold skin flap and then tefla dressing held in place with elastic wrap. At today's visit the skin tear looks OK, no evidence of infection. Steri-strips were left in place TegaDerm was applied. Will not need to use elastic wrap. Plan is to return in 1 week.

## 2011-12-05 ENCOUNTER — Ambulatory Visit (INDEPENDENT_AMBULATORY_CARE_PROVIDER_SITE_OTHER): Payer: Medicare Other | Admitting: General Practice

## 2011-12-05 ENCOUNTER — Ambulatory Visit (INDEPENDENT_AMBULATORY_CARE_PROVIDER_SITE_OTHER): Payer: Medicare Other | Admitting: Internal Medicine

## 2011-12-05 ENCOUNTER — Encounter: Payer: Self-pay | Admitting: Internal Medicine

## 2011-12-05 VITALS — BP 128/62 | HR 71 | Temp 97.6°F | Resp 16 | Wt 114.0 lb

## 2011-12-05 DIAGNOSIS — Z5189 Encounter for other specified aftercare: Secondary | ICD-10-CM

## 2011-12-05 DIAGNOSIS — I6350 Cerebral infarction due to unspecified occlusion or stenosis of unspecified cerebral artery: Secondary | ICD-10-CM

## 2011-12-05 DIAGNOSIS — I4891 Unspecified atrial fibrillation: Secondary | ICD-10-CM

## 2011-12-05 DIAGNOSIS — Z7901 Long term (current) use of anticoagulants: Secondary | ICD-10-CM

## 2011-12-05 NOTE — Patient Instructions (Addendum)
Wound looks better and much of the skin flap was removed. But, it is not healed. Return next week - if we don't see real progress I will want the wound experts to take a look.

## 2011-12-09 NOTE — Progress Notes (Signed)
  Subjective:    Patient ID: [name] [name], female    DOB: Mar 27, 1918, 78 y.o.   MRN: [id number]  HPI [name] presents for wound check - skin tear left ankle and left proximal UE. She has had no fever or chills, no wound drainage.  PMH, FamHx and SocHx reviewed for any changes and relevance.    Review of Systems System review is negative for any constitutional, cardiac, pulmonary, GI or neuro symptoms or complaints other than as described in the HPI.     Objective:   Physical Exam Filed Vitals:   12/05/11 1448  BP: 128/62  Pulse: 71  Temp: 97.6 F (36.4 C)  Resp: 16   Gen'l- WNWD white elderly woman Derm - carefully removed tegaderm. Debrided devitalized skin flap. Appearance of granulation tissue over wound. No exudate, no odor, no heat, mild tenderness. Cleaned with betadine, rinsed, dried and applied new tegaderm. Arm wound healed.       Assessment & Plan:  Skin tear - improving Plan- redressed with tegaderm  For lack of progression at next wound check will refer to Wound center.

## 2011-12-10 ENCOUNTER — Telehealth: Payer: Self-pay | Admitting: Internal Medicine

## 2011-12-10 ENCOUNTER — Encounter: Payer: Self-pay | Admitting: Internal Medicine

## 2011-12-10 ENCOUNTER — Ambulatory Visit: Payer: Medicare Other | Admitting: Endocrinology

## 2011-12-10 ENCOUNTER — Ambulatory Visit (INDEPENDENT_AMBULATORY_CARE_PROVIDER_SITE_OTHER): Payer: Medicare Other | Admitting: Internal Medicine

## 2011-12-10 VITALS — BP 112/40 | HR 74 | Temp 98.1°F | Resp 14 | Wt 114.0 lb

## 2011-12-10 DIAGNOSIS — S91009A Unspecified open wound, unspecified ankle, initial encounter: Secondary | ICD-10-CM

## 2011-12-10 DIAGNOSIS — S81809A Unspecified open wound, unspecified lower leg, initial encounter: Secondary | ICD-10-CM

## 2011-12-10 NOTE — Patient Instructions (Addendum)
Ankle wound - does not look infected or feel hot.  Plan  Leave tegaderm in place  OK to cover with a dry guaze dressing  Will make referral to the wound center  Blood pressure - a bit low today. Plan Continue your diltizaem  Hold the hydrochlorothiazide until BP is better.

## 2011-12-10 NOTE — Progress Notes (Signed)
  Subjective:    Patient ID: [name] [name], female    DOB: 19-Oct-1918, 78 y.o.   MRN: [id number]  HPI Patient being followed for slow to heal laceration/wound lateral left ankle after a fall. She returns 2 days early due to concern for drainage and possible infection. She has had minor pain. She denies fever or chills.   PMH, FamHx and SocHx reviewed for any changes and relevance.  meds reviewed   Review of Systems System review is negative for any constitutional, cardiac, pulmonary, GI or neuro symptoms or complaints other than as described in the HPI.     Objective:   Physical Exam Filed Vitals:   12/10/11 1513  BP: 112/40  Pulse: 74  Temp: 98.1 F (36.7 C)  Resp: 14   Derm - left lateral ankle with 3x3 cm wound with granulated base and some fibrous coagulum. No redness around wound, no heat, no frank pus or drainage.       Assessment & Plan:  Ankle wound - see previous weekly notes. Looks to be healing slowly. No evidence of frank infection.  Plan  Redressed with tegaderm  Referral to wound center.

## 2011-12-10 NOTE — Telephone Encounter (Signed)
Caller: [name]/Child; Patient Name: [name] [name]; PCP: [name] (Adults only); Best Callback Phone Number: [phone], Call regarding skin tear to left ankle. Patient has been seeing Dr. Debby Bud once weekly for 6 weeks for wound treatment. Last week Dr. Debby Bud suggested going to the wound center if the wound is not starting to heal. Appointment has been scheduled for Wednesday 12/12/11. Reports that there is  clear/yellow drainage at this time. Also reports increasing redness/warmth to the area. Afebrile. Patient is not currently taking any medication for this wound. Also reports increased pain to the area. Emergent symptom of "New or increased redness, swelling, or pain around surgical would or drain site" positive per Postoperative Wound Care guideline. Disposition: See provider within 4 hours. Appointment scheduled with Dr. Everardo All 12/10/11 at 3:00pm; no appointments were available with Dr. Debby Bud. Care advice and call back parameters given per guideline. Caller verbalized understanding.

## 2011-12-12 ENCOUNTER — Ambulatory Visit: Payer: Medicare Other | Admitting: Internal Medicine

## 2011-12-21 ENCOUNTER — Encounter (HOSPITAL_BASED_OUTPATIENT_CLINIC_OR_DEPARTMENT_OTHER): Payer: Medicare Other | Attending: General Surgery

## 2011-12-21 DIAGNOSIS — I87309 Chronic venous hypertension (idiopathic) without complications of unspecified lower extremity: Secondary | ICD-10-CM | POA: Insufficient documentation

## 2011-12-21 DIAGNOSIS — L97309 Non-pressure chronic ulcer of unspecified ankle with unspecified severity: Secondary | ICD-10-CM | POA: Insufficient documentation

## 2011-12-21 DIAGNOSIS — I872 Venous insufficiency (chronic) (peripheral): Secondary | ICD-10-CM | POA: Insufficient documentation

## 2011-12-21 NOTE — Progress Notes (Signed)
[facility]  NAME:  [name] [name], [name] [name]             ACCOUNT NO.:  [id number]  MEDICAL RECORD NO.:  [id number]      DATE OF BIRTH:  02-27-1919  PHYSICIAN:  Ardath Sax, M.D.           VISIT DATE:                                  OFFICE VISIT   This is a delightful 78 year old lady who comes here with a longstanding wound over her left lateral malleolus.  She apparently fell about 6 weeks ago and suffered a laceration and now the skin is just sloughed off and she has about a 2 cm ulcer over the malleolus.  Other than that, she is very healthy.  Her blood pressure is 112/40, pulse 70, temperature 98.6, respirations 14.  She is very alert and cooperative. She has no history of any particular diseases.  She is not on any medicine.  She ambulates, eats well.  She has a normal weight and very alert mentally.  Today, we look at this wound and I debrided it of devitalized tissue and we did this under local anesthesia and we are going to treat it with Santyl.  I wrote a prescription for the Santyl and she will come back in a week and perhaps at that time we can start using something like Endoform.     Ardath Sax, M.D.     PP/MEDQ  D:  12/21/2011  T:  12/21/2011  Job:  [id number]

## 2011-12-28 ENCOUNTER — Ambulatory Visit (INDEPENDENT_AMBULATORY_CARE_PROVIDER_SITE_OTHER): Payer: Medicare Other | Admitting: General Practice

## 2011-12-28 DIAGNOSIS — Z7901 Long term (current) use of anticoagulants: Secondary | ICD-10-CM

## 2011-12-28 DIAGNOSIS — I6350 Cerebral infarction due to unspecified occlusion or stenosis of unspecified cerebral artery: Secondary | ICD-10-CM

## 2011-12-28 DIAGNOSIS — I4891 Unspecified atrial fibrillation: Secondary | ICD-10-CM

## 2012-01-08 ENCOUNTER — Ambulatory Visit: Payer: Medicare Other

## 2012-01-11 ENCOUNTER — Encounter (HOSPITAL_BASED_OUTPATIENT_CLINIC_OR_DEPARTMENT_OTHER): Payer: Medicare Other | Attending: General Surgery

## 2012-01-11 ENCOUNTER — Ambulatory Visit: Payer: Medicare Other

## 2012-01-11 ENCOUNTER — Ambulatory Visit (INDEPENDENT_AMBULATORY_CARE_PROVIDER_SITE_OTHER): Payer: Medicare Other

## 2012-01-11 DIAGNOSIS — L97909 Non-pressure chronic ulcer of unspecified part of unspecified lower leg with unspecified severity: Secondary | ICD-10-CM | POA: Insufficient documentation

## 2012-01-11 DIAGNOSIS — Z23 Encounter for immunization: Secondary | ICD-10-CM

## 2012-01-11 DIAGNOSIS — I87319 Chronic venous hypertension (idiopathic) with ulcer of unspecified lower extremity: Secondary | ICD-10-CM | POA: Insufficient documentation

## 2012-01-18 ENCOUNTER — Encounter (HOSPITAL_BASED_OUTPATIENT_CLINIC_OR_DEPARTMENT_OTHER): Payer: Medicare Other

## 2012-02-01 ENCOUNTER — Ambulatory Visit (INDEPENDENT_AMBULATORY_CARE_PROVIDER_SITE_OTHER): Payer: Medicare Other | Admitting: General Practice

## 2012-02-01 DIAGNOSIS — Z7901 Long term (current) use of anticoagulants: Secondary | ICD-10-CM

## 2012-02-01 DIAGNOSIS — I6350 Cerebral infarction due to unspecified occlusion or stenosis of unspecified cerebral artery: Secondary | ICD-10-CM

## 2012-02-01 DIAGNOSIS — I4891 Unspecified atrial fibrillation: Secondary | ICD-10-CM

## 2012-02-01 LAB — POCT INR: INR: 1.2

## 2012-02-11 ENCOUNTER — Ambulatory Visit (INDEPENDENT_AMBULATORY_CARE_PROVIDER_SITE_OTHER): Payer: Medicare Other | Admitting: General Practice

## 2012-02-11 DIAGNOSIS — I4891 Unspecified atrial fibrillation: Secondary | ICD-10-CM

## 2012-02-11 DIAGNOSIS — I6350 Cerebral infarction due to unspecified occlusion or stenosis of unspecified cerebral artery: Secondary | ICD-10-CM

## 2012-02-11 DIAGNOSIS — Z7901 Long term (current) use of anticoagulants: Secondary | ICD-10-CM

## 2012-02-15 ENCOUNTER — Encounter (HOSPITAL_BASED_OUTPATIENT_CLINIC_OR_DEPARTMENT_OTHER): Payer: Medicare Other | Attending: General Surgery

## 2012-02-15 DIAGNOSIS — L97909 Non-pressure chronic ulcer of unspecified part of unspecified lower leg with unspecified severity: Secondary | ICD-10-CM | POA: Insufficient documentation

## 2012-02-15 DIAGNOSIS — I87319 Chronic venous hypertension (idiopathic) with ulcer of unspecified lower extremity: Secondary | ICD-10-CM | POA: Insufficient documentation

## 2012-02-20 ENCOUNTER — Other Ambulatory Visit: Payer: Self-pay | Admitting: Internal Medicine

## 2012-02-21 ENCOUNTER — Other Ambulatory Visit: Payer: Self-pay | Admitting: *Deleted

## 2012-02-21 MED ORDER — WARFARIN SODIUM 5 MG PO TABS: 5.0000 mg | ORAL_TABLET | Freq: Every day | ORAL | Status: DC

## 2012-02-25 ENCOUNTER — Other Ambulatory Visit: Payer: Self-pay | Admitting: Internal Medicine

## 2012-03-14 ENCOUNTER — Encounter (HOSPITAL_BASED_OUTPATIENT_CLINIC_OR_DEPARTMENT_OTHER): Payer: Medicare Other

## 2012-03-25 ENCOUNTER — Ambulatory Visit (INDEPENDENT_AMBULATORY_CARE_PROVIDER_SITE_OTHER): Payer: Medicare Other | Admitting: General Practice

## 2012-03-25 DIAGNOSIS — I6350 Cerebral infarction due to unspecified occlusion or stenosis of unspecified cerebral artery: Secondary | ICD-10-CM

## 2012-03-25 DIAGNOSIS — I4891 Unspecified atrial fibrillation: Secondary | ICD-10-CM

## 2012-03-25 DIAGNOSIS — Z7901 Long term (current) use of anticoagulants: Secondary | ICD-10-CM

## 2012-04-17 ENCOUNTER — Other Ambulatory Visit (INDEPENDENT_AMBULATORY_CARE_PROVIDER_SITE_OTHER): Payer: Medicare Other

## 2012-04-17 ENCOUNTER — Ambulatory Visit (INDEPENDENT_AMBULATORY_CARE_PROVIDER_SITE_OTHER): Payer: Medicare Other | Admitting: Internal Medicine

## 2012-04-17 ENCOUNTER — Encounter: Payer: Self-pay | Admitting: Internal Medicine

## 2012-04-17 ENCOUNTER — Telehealth: Payer: Self-pay | Admitting: Internal Medicine

## 2012-04-17 VITALS — BP 140/68 | HR 73 | Temp 97.3°F | Resp 10 | Wt 112.0 lb

## 2012-04-17 DIAGNOSIS — R0600 Dyspnea, unspecified: Secondary | ICD-10-CM

## 2012-04-17 DIAGNOSIS — K5792 Diverticulitis of intestine, part unspecified, without perforation or abscess without bleeding: Secondary | ICD-10-CM

## 2012-04-17 DIAGNOSIS — R0989 Other specified symptoms and signs involving the circulatory and respiratory systems: Secondary | ICD-10-CM

## 2012-04-17 DIAGNOSIS — K5732 Diverticulitis of large intestine without perforation or abscess without bleeding: Secondary | ICD-10-CM

## 2012-04-17 LAB — CBC WITH DIFFERENTIAL/PLATELET
Basophils Relative: 0.3 % (ref 0.0–3.0)
Eosinophils Relative: 0.6 % (ref 0.0–5.0)
HCT: 45.9 % (ref 36.0–46.0)
Hemoglobin: 15.3 g/dL — ABNORMAL HIGH (ref 12.0–15.0)
Lymphs Abs: 1.9 10*3/uL (ref 0.7–4.0)
MCV: 92.9 fl (ref 78.0–100.0)
Monocytes Absolute: 1.2 10*3/uL — ABNORMAL HIGH (ref 0.1–1.0)
RBC: 4.94 Mil/uL (ref 3.87–5.11)
WBC: 17.3 10*3/uL — ABNORMAL HIGH (ref 4.5–10.5)

## 2012-04-17 MED ORDER — AMOXICILLIN-POT CLAVULANATE 875-125 MG PO TABS: 1.0000 | ORAL_TABLET | Freq: Two times a day (BID) | ORAL | Status: DC

## 2012-04-17 MED ORDER — TRAMADOL HCL 50 MG PO TABS: 50.0000 mg | ORAL_TABLET | Freq: Three times a day (TID) | ORAL | Status: DC | PRN

## 2012-04-17 NOTE — Telephone Encounter (Signed)
Please read phone note below, to be aware of pt's situation.

## 2012-04-17 NOTE — Telephone Encounter (Signed)
Patient Information:  Caller Name: Lurena Joiner  Phone: [phone]  Patient: [name] [name], [name] [name]  Gender: Female  DOB: 07-31-1918  Age: 79 Years  PCP: [name] (Adults only)  Office Follow Up:  Does the office need to follow up with this patient?: No  Instructions For The Office: N/A   Symptoms  Reason For Call & Symptoms: Daughter states her mother is Just completed Prednisone pack taper completed last Saturday 04/12/12 for Rheumatoid Arthritis. .  Daughter states her mother is having severe constipation from the steroids . She also has a history of Diverticulitis.  Last stool two days ago 04/15/12 and soft. She normally has stools a couple of times a day. She has been in the bathroom today -with cramps and aches .   Daughter is unable to assist with triage. Conferenced Ms. Dick [phone].  Pain in "lower intestine" . Stomach  Aches with easing. Blood spot  is  noted underwear and on tissue.  Ongoing abdominal pain . She believes this to be diverticulitis discomfort.   Reviewed Health History In EMR: Yes  Reviewed Medications In EMR: Yes  Reviewed Allergies In EMR: Yes  Reviewed Surgeries / Procedures: No  Date of Onset of Symptoms: 04/15/2012  Guideline(s) Used:  Abdominal Pain - Female  Disposition Per Guideline:   See Today in Office  Reason For Disposition Reached:   Age > 60 years  Advice Given:  Rest:  Lie down and rest until you feel better.  Fluids:  Sip clear fluids only (e.g., water, flat soft drinks or 1/2 strength fruit juice) until the pain has been gone for over 2 hours. Then slowly return to a regular diet.  Diet:  Slowly advance diet from clear liquids to a bland diet  Avoid alcohol or caffeinated beverages  Avoid greasy or fatty foods.  Pass a BM:  Sit on the toilet and try to pass a bowel movement (BM). Do not strain. This may relieve the pain if it is due to constipation or impending diarrhea.  Avoid NSAIDS and Aspirin  : Avoid any drug that can  irritate the stomach lining and make the pain worse (especially aspirin and NSAIDs like ibuprofen).  Call Back If:  Abdominal pain is constant and present for more than 2 hours  Abdominal pains come and go and are present for more than 24 hours  Appointment Scheduled:  04/17/2012 16:00:00 Appointment Scheduled Provider:  [name] (Adults only)

## 2012-04-17 NOTE — Progress Notes (Signed)
Subjective:    Patient ID: [name] [name], female    DOB: [birth year], 79 y.o.   MRN: [id number]  HPI [name] presents for acute abdominal pain.   Interval history - her leg wound did heal after two months of treatment at the wound center.   She had a flare of her RA and she was seen by Dr. Dierdre Forth and was treated with steroids for many days. She was fine on Monday. Starting Farrell Ours 2/5 she had lower abdominal pain - continuous ache. No sharp pain. No dysuria, no frequency or urgency. She does have a regular Bowel movement. No fever, she has had a little blood in the stool, similar to previous flares of diverticulitis.  Past Medical History  Diagnosis Date  . Unspecified cerebral artery occlusion with cerebral infarction   . Recurrent cold sores   . Xanthelasma of eyelid(374.51)   . Ganglion cyst   . Anatomical narrow angle borderline glaucoma   . Hypothyroidism   . Hypertension   . Atrial fibrillation   . Sjogren's syndrome   . Goiter   . Rheumatoid arthritis   . Osteoporosis    Past Surgical History  Procedure Date  . Appendectomy   . Vaginal cyst removed 1998    History reviewed. No pertinent family history. History   Social History  . Marital Status: Single    Spouse Name: N/A    Number of Children: N/A  . Years of Education: N/A   Occupational History  . Not on file.   Social History Main Topics  . Smoking status: Never Smoker   . Smokeless tobacco: Not on file  . Alcohol Use: No  . Drug Use: No  . Sexually Active: Not Currently   Other Topics Concern  . Not on file   Social History Narrative   HSGMarried '43-29 years, widowed1 daughter- '56 adopted; 1 grandsonLives alone- I ADLSEnd of Life: still has packet at home, she has not made a decision (rediscussed 9/11)    Current Outpatient Prescriptions on File Prior to Visit  Medication Sig Dispense Refill  . digoxin (LANOXIN) 0.125 MG tablet Take 1/2 tablet by mouth daily      . diltiazem (CARDIZEM  CD) 240 MG 24 hr capsule take 1 capsule by mouth once daily  30 capsule  5  . folic acid (FOLVITE) 1 MG tablet Take 1 mg by mouth daily.        . hydrochlorothiazide (HYDRODIURIL) 25 MG tablet take 1/2 tablet by mouth once daily  30 tablet  6  . levothyroxine (SYNTHROID, LEVOTHROID) 75 MCG tablet take 1 tablet by mouth once daily  90 tablet  3  . methotrexate 2.5 MG tablet Take 15 mg by mouth once a week. Takes on Wednesday      . warfarin (COUMADIN) 2.5 MG tablet Take 2.5 mg by mouth daily.      Marland Kitchen warfarin (COUMADIN) 5 MG tablet Take 1 tablet (5 mg total) by mouth daily. Take as directed by coumadin clinic  30 tablet  2  . predniSONE (DELTASONE) 5 MG tablet Take 5 mg by mouth daily.            Review of Systems System review is negative for any constitutional, cardiac, pulmonary, GI or neuro symptoms or complaints other than as described in the HPI.  She does c/o right eye seems swollen      Objective:   Physical Exam Filed Vitals:   04/17/12 1641  BP: 140/68  Pulse: 73  Temp: 97.3 F (36.3 C)  Resp: 10   Gen'l - elderly white woman in no acute distress. Did require 2+ assist to get up to exam table HEENT- C&S clear Cor - RRR Pulm - normal respirations Abd - BS+ x 4, no HSM, no guarding but definitely very tender lower abdomen much more at LLQ.  CBC    Component Value Date/Time   WBC 17.3* 04/17/2012 1722   RBC 4.94 04/17/2012 1722   HGB 15.3* 04/17/2012 1722   HCT 45.9 04/17/2012 1722   PLT 266.0 04/17/2012 1722   MCV 92.9 04/17/2012 1722   MCH 33.0 11/17/2011 0953   MCHC 33.3 04/17/2012 1722   RDW 17.2* 04/17/2012 1722   LYMPHSABS 1.9 04/17/2012 1722   MONOABS 1.2* 04/17/2012 1722   EOSABS 0.1 04/17/2012 1722   BASOSABS 0.0 04/17/2012 1722  81 % segs pBNP 153 LFT's normal         Assessment & Plan:

## 2012-04-17 NOTE — Patient Instructions (Addendum)
Abdominal pain is very suggestive of diverticulitis. Plan Augmentin twice a day for 10 days  Lab today - white blood count.  Tramadol 50 mg every 8 hours for pain as needed.  No dietary restrictions - anything you want to eat or drink, but you do need to be sure to drink  For lack of improvement over the next 24 hours on antibiotics will need to know so a CT scan abdomen can be schedule (may call [phone]) if you need to call over the weekend.  Mild increased work of breathing/shortness of breath Plan Continue all your medications  Will check a lab for fluid build up  Diverticulitis A diverticulum is a small pouch or sac on the colon. Diverticulosis is the presence of these diverticula on the colon. Diverticulitis is the irritation (inflammation) or infection of diverticula. CAUSES   The colon and its diverticula contain bacteria. If food particles block the tiny opening to a diverticulum, the bacteria inside can grow and cause an increase in pressure. This leads to infection and inflammation and is called diverticulitis. SYMPTOMS    Abdominal pain and tenderness. Usually, the pain is located on the left side of your abdomen. However, it could be located elsewhere.   Fever.   Bloating.   Feeling sick to your stomach (nausea).   Throwing up (vomiting).   Abnormal stools.  DIAGNOSIS   Your caregiver will take a history and perform a physical exam. Since many things can cause abdominal pain, other tests may be necessary. Tests may include:  Blood tests.   Urine tests.   X-ray of the abdomen.   CT scan of the abdomen.  Sometimes, surgery is needed to determine if diverticulitis or other conditions are causing your symptoms. TREATMENT   Most of the time, you can be treated without surgery. Treatment includes:  Resting the bowels by only having liquids for a few days. As you improve, you will need to eat a low-fiber diet.   Intravenous (IV) fluids if you are losing body fluids  (dehydrated).   Antibiotic medicines that treat infections may be given.   Pain and nausea medicine, if needed.   Surgery if the inflamed diverticulum has burst.  HOME CARE INSTRUCTIONS    Try a clear liquid diet (broth, tea, or water for as long as directed by your caregiver). You may then gradually begin a low-fiber diet as tolerated. A low-fiber diet is a diet with less than 10 grams of fiber. Choose the foods below to reduce fiber in the diet:   White breads, cereals, rice, and pasta.   Cooked fruits and vegetables or soft fresh fruits and vegetables without the skin.   Ground or well-cooked tender beef, ham, veal, lamb, pork, or poultry.   Eggs and seafood.   After your diverticulitis symptoms have improved, your caregiver may put you on a high-fiber diet. A high-fiber diet includes 14 grams of fiber for every 1000 calories consumed. For a standard 2000 calorie diet, you would need 28 grams of fiber. Follow these diet guidelines to help you increase the fiber in your diet. It is important to slowly increase the amount fiber in your diet to avoid gas, constipation, and bloating.   Choose whole-grain breads, cereals, pasta, and brown rice.   Choose fresh fruits and vegetables with the skin on. Do not overcook vegetables because the more vegetables are cooked, the more fiber is lost.   Choose more nuts, seeds, legumes, dried peas, beans, and lentils.   Look  for food products that have greater than 3 grams of fiber per serving on the Nutrition Facts label.   Take all medicine as directed by your caregiver.   If your caregiver has given you a follow-up appointment, it is very important that you go. Not going could result in lasting (chronic) or permanent injury, pain, and disability. If there is any problem keeping the appointment, call to reschedule.  SEEK MEDICAL CARE IF:    Your pain does not improve.   You have a hard time advancing your diet beyond clear liquids.   Your  bowel movements do not return to normal.  SEEK IMMEDIATE MEDICAL CARE IF:    Your pain becomes worse.   You have an oral temperature above 102 F (38.9 C), not controlled by medicine.   You have repeated vomiting.   You have bloody or black, tarry stools.   Symptoms that brought you to your caregiver become worse or are not getting better.  MAKE SURE YOU:    Understand these instructions.   Will watch your condition.   Will get help right away if you are not doing well or get worse.  Document Released: 12/06/2004 Document Revised: 05/21/2011 Document Reviewed: 04/03/2010 [facility] Patient Information 2013 [place], [place].

## 2012-04-18 LAB — COMPREHENSIVE METABOLIC PANEL
Albumin: 3.1 g/dL — ABNORMAL LOW (ref 3.5–5.2)
Alkaline Phosphatase: 50 U/L (ref 39–117)
BUN: 14 mg/dL (ref 6–23)
CO2: 31 mEq/L (ref 19–32)
GFR: 81.81 mL/min (ref >60.00)
Glucose, Bld: 123 mg/dL — ABNORMAL HIGH (ref 70–99)
Total Bilirubin: 0.8 mg/dL (ref 0.3–1.2)

## 2012-04-18 LAB — BRAIN NATRIURETIC PEPTIDE: Pro B Natriuretic peptide (BNP): 153 pg/mL — ABNORMAL HIGH (ref 0.0–100.0)

## 2012-04-19 DIAGNOSIS — K5792 Diverticulitis of intestine, part unspecified, without perforation or abscess without bleeding: Secondary | ICD-10-CM | POA: Insufficient documentation

## 2012-04-19 NOTE — Assessment & Plan Note (Signed)
Presenting with LLQ pain, mild hematochezia and feeling ill. Tender on exam. Leukocytosis with left shift.  Plan  Augmentin 875 mg bid x 10 days  Call for increasing pain, fever, bloody stools.

## 2012-04-21 ENCOUNTER — Telehealth: Payer: Self-pay | Admitting: Internal Medicine

## 2012-04-21 NOTE — Telephone Encounter (Signed)
Patient Information:  Caller Name: Lurena Joiner  Phone: [phone]  Patient: [name] [name], [name] [name]  Gender: Female  DOB: 1918-12-17  Age: 79 Years  PCP: [name] (Adults only)  Office Follow Up:  Does the office need to follow up with this patient?: Yes  Instructions For The Office: OFFICE PLEASE REVIEW AND CALL DAUGHTER BACK WITHIN 1 HOUR PER NURSING JUDGEMENT- PT HAVING ABDOMINAL PAIN AND CAN NOT URINATE- DAUGHTER WANTS DR NORMINS TO REVIEW SINCE HE IS FAMILIAR WITH PT  RN Note:  Daughter would like Dr Debby Bud to be aware of this since he is familiar with her mom.  Pt to go to ED or  office with PCP approval per triage disposition- No appt available to be seen now- OFFICE PLEASE REVIEW AND CALL DAUGHTER BACK AT [phone] OR [phone]  Symptoms  Reason For Call & Symptoms: Daugther calling and states that mom was seen in the office on 04/17/12 for abdominal pain; Augmenitn was started and instructed to call back if sx worsen or not improved; sx seemed to improve but on 04/20/12 started to have trouble urinating;  only able to urinate a few dripples of urine; but still drinking wnl for pt; has no feeling that she needs to go; bladder feels full;  last time urinated was 04/20/12; still having abdomibnal pain and no SOB but seems to be getting weaker; MD talked about a CT scan would be needed on 04/17/12;  Reviewed Health History In EMR: Yes  Reviewed Medications In EMR: Yes  Reviewed Allergies In EMR: Yes  Reviewed Surgeries / Procedures: Yes  Date of Onset of Symptoms: 04/20/2012  Guideline(s) Used:  Abdominal Pain - Female  Disposition Per Guideline:   Go to ED Now (or to Office with PCP Approval)  Reason For Disposition Reached:   Constant abdominal pain lasting > 2 hours  Advice Given:  Call Back If:  You become worse.

## 2012-04-21 NOTE — Telephone Encounter (Signed)
Pt's daughter called back.  [name] [name] has started to urinate. She has been drinking more water and has urinated twice.

## 2012-04-21 NOTE — Telephone Encounter (Signed)
Please read phone note below an advise.

## 2012-04-22 ENCOUNTER — Inpatient Hospital Stay (HOSPITAL_COMMUNITY): Payer: Medicare Other

## 2012-04-22 ENCOUNTER — Telehealth: Payer: Self-pay | Admitting: *Deleted

## 2012-04-22 ENCOUNTER — Inpatient Hospital Stay (HOSPITAL_COMMUNITY)
Admission: AD | Admit: 2012-04-22 | Discharge: 2012-04-25 | DRG: 392 | Disposition: A | Discharge status: Home / Self Care | Payer: Medicare Other | Source: Ambulatory Visit | Attending: Internal Medicine | Admitting: Internal Medicine

## 2012-04-22 ENCOUNTER — Encounter (HOSPITAL_COMMUNITY): Payer: Self-pay | Admitting: *Deleted

## 2012-04-22 DIAGNOSIS — M3500 Sicca syndrome, unspecified: Secondary | ICD-10-CM | POA: Diagnosis present

## 2012-04-22 DIAGNOSIS — K5732 Diverticulitis of large intestine without perforation or abscess without bleeding: Principal | ICD-10-CM | POA: Diagnosis present

## 2012-04-22 DIAGNOSIS — M810 Age-related osteoporosis without current pathological fracture: Secondary | ICD-10-CM | POA: Diagnosis present

## 2012-04-22 DIAGNOSIS — E039 Hypothyroidism, unspecified: Secondary | ICD-10-CM | POA: Diagnosis present

## 2012-04-22 DIAGNOSIS — I4891 Unspecified atrial fibrillation: Secondary | ICD-10-CM | POA: Diagnosis present

## 2012-04-22 DIAGNOSIS — Z8673 Personal history of transient ischemic attack (TIA), and cerebral infarction without residual deficits: Secondary | ICD-10-CM

## 2012-04-22 DIAGNOSIS — I10 Essential (primary) hypertension: Secondary | ICD-10-CM | POA: Diagnosis present

## 2012-04-22 DIAGNOSIS — K5792 Diverticulitis of intestine, part unspecified, without perforation or abscess without bleeding: Secondary | ICD-10-CM

## 2012-04-22 DIAGNOSIS — M069 Rheumatoid arthritis, unspecified: Secondary | ICD-10-CM | POA: Diagnosis present

## 2012-04-22 LAB — COMPREHENSIVE METABOLIC PANEL
ALT: 20 U/L (ref 0–35)
Albumin: 3.1 g/dL — ABNORMAL LOW (ref 3.5–5.2)
Alkaline Phosphatase: 56 U/L (ref 39–117)
BUN: 10 mg/dL (ref 6–23)
Chloride: 93 mEq/L — ABNORMAL LOW (ref 96–112)
GFR calc Af Amer: >90 mL/min (ref >90)
Glucose, Bld: 124 mg/dL — ABNORMAL HIGH (ref 70–99)
Potassium: 3.3 mEq/L — ABNORMAL LOW (ref 3.5–5.1)
Sodium: 131 mEq/L — ABNORMAL LOW (ref 135–145)
Total Bilirubin: 0.5 mg/dL (ref 0.3–1.2)
Total Protein: 6.8 g/dL (ref 6.0–8.3)

## 2012-04-22 LAB — CBC WITH DIFFERENTIAL/PLATELET
Basophils Absolute: 0 10*3/uL (ref 0.0–0.1)
Basophils Relative: 0 % (ref 0–1)
Eosinophils Absolute: 0.2 10*3/uL (ref 0.0–0.7)
Eosinophils Relative: 1 % (ref 0–5)
HCT: 44.3 % (ref 36.0–46.0)
Lymphocytes Relative: 7 % — ABNORMAL LOW (ref 12–46)
MCHC: 33.6 g/dL (ref 30.0–36.0)
MCV: 94.1 fL (ref 78.0–100.0)
Monocytes Absolute: 1.2 10*3/uL — ABNORMAL HIGH (ref 0.1–1.0)
Platelets: 192 10*3/uL (ref 150–400)
RDW: 15.9 % — ABNORMAL HIGH (ref 11.5–15.5)
WBC: 19.7 10*3/uL — ABNORMAL HIGH (ref 4.0–10.5)

## 2012-04-22 LAB — URINALYSIS, ROUTINE W REFLEX MICROSCOPIC
Bilirubin Urine: NEGATIVE
Ketones, ur: NEGATIVE mg/dL
Nitrite: NEGATIVE
Protein, ur: NEGATIVE mg/dL
Urobilinogen, UA: 0.2 mg/dL (ref 0.0–1.0)

## 2012-04-22 LAB — URINE MICROSCOPIC-ADD ON

## 2012-04-22 MED ORDER — HYDROCHLOROTHIAZIDE 25 MG PO TABS
25.0000 mg | ORAL_TABLET | Freq: Every day | ORAL | Status: DC
  Administered 2012-04-23 – 2012-04-25 (×3): 25 mg via ORAL
  Filled 2012-04-22 (×3): qty 1

## 2012-04-22 MED ORDER — ACETAMINOPHEN 650 MG RE SUPP: 650.0000 mg | Freq: Four times a day (QID) | RECTAL | Status: DC | PRN

## 2012-04-22 MED ORDER — ACETAMINOPHEN 325 MG PO TABS
650.0000 mg | ORAL_TABLET | Freq: Four times a day (QID) | ORAL | Status: DC | PRN
  Administered 2012-04-24: 650 mg via ORAL
  Filled 2012-04-22: qty 2

## 2012-04-22 MED ORDER — MAGNESIUM HYDROXIDE 400 MG/5ML PO SUSP: 30.0000 mL | Freq: Every day | ORAL | Status: DC | PRN

## 2012-04-22 MED ORDER — DEXTROSE-NACL 5-0.45 % IV SOLN
INTRAVENOUS | Status: DC
  Administered 2012-04-22 – 2012-04-23 (×2): via INTRAVENOUS

## 2012-04-22 MED ORDER — LEVOTHYROXINE SODIUM 75 MCG PO TABS
75.0000 ug | ORAL_TABLET | Freq: Every day | ORAL | Status: DC
  Administered 2012-04-23 – 2012-04-25 (×3): 75 ug via ORAL
  Filled 2012-04-22 (×4): qty 1

## 2012-04-22 MED ORDER — IOHEXOL 300 MG/ML  SOLN
100.0000 mL | Freq: Once | INTRAMUSCULAR | Status: AC | PRN
  Administered 2012-04-22: 80 mL via INTRAVENOUS

## 2012-04-22 MED ORDER — WARFARIN - PHYSICIAN DOSING INPATIENT: Freq: Every day | Status: DC

## 2012-04-22 MED ORDER — DILTIAZEM HCL ER COATED BEADS 240 MG PO CP24
240.0000 mg | ORAL_CAPSULE | Freq: Every day | ORAL | Status: DC
  Administered 2012-04-23 – 2012-04-25 (×3): 240 mg via ORAL
  Filled 2012-04-22 (×4): qty 1

## 2012-04-22 MED ORDER — IOHEXOL 300 MG/ML  SOLN
25.0000 mL | Freq: Once | INTRAMUSCULAR | Status: AC | PRN
  Administered 2012-04-22: 25 mL via ORAL

## 2012-04-22 MED ORDER — DIGOXIN 0.0625 MG HALF TABLET
0.0625 mg | ORAL_TABLET | Freq: Every day | ORAL | Status: DC
  Administered 2012-04-23 – 2012-04-25 (×3): 0.0625 mg via ORAL
  Filled 2012-04-22 (×3): qty 1

## 2012-04-22 MED ORDER — FOLIC ACID 1 MG PO TABS
1.0000 mg | ORAL_TABLET | Freq: Every day | ORAL | Status: DC
  Administered 2012-04-23 – 2012-04-25 (×3): 1 mg via ORAL
  Filled 2012-04-22 (×3): qty 1

## 2012-04-22 MED ORDER — PREDNISONE 5 MG PO TABS
5.0000 mg | ORAL_TABLET | Freq: Every day | ORAL | Status: DC
  Administered 2012-04-23 – 2012-04-25 (×3): 5 mg via ORAL
  Filled 2012-04-22 (×3): qty 1

## 2012-04-22 MED ORDER — POTASSIUM CHLORIDE CRYS ER 20 MEQ PO TBCR
20.0000 meq | EXTENDED_RELEASE_TABLET | Freq: Two times a day (BID) | ORAL | Status: AC
  Administered 2012-04-22 – 2012-04-24 (×4): 20 meq via ORAL
  Filled 2012-04-22 (×5): qty 1

## 2012-04-22 MED ORDER — METHOTREXATE 2.5 MG PO TABS
15.0000 mg | ORAL_TABLET | ORAL | Status: DC
  Administered 2012-04-23: 15 mg via ORAL
  Filled 2012-04-22: qty 6

## 2012-04-22 MED ORDER — PIPERACILLIN-TAZOBACTAM 3.375 G IVPB
3.3750 g | Freq: Once | INTRAVENOUS | Status: DC
  Filled 2012-04-22: qty 50

## 2012-04-22 MED ORDER — WARFARIN SODIUM 5 MG PO TABS
5.0000 mg | ORAL_TABLET | Freq: Every day | ORAL | Status: DC
  Administered 2012-04-22 – 2012-04-23 (×2): 5 mg via ORAL
  Filled 2012-04-22 (×4): qty 1

## 2012-04-22 MED ORDER — PIPERACILLIN-TAZOBACTAM 3.375 G IVPB
3.3750 g | Freq: Three times a day (TID) | INTRAVENOUS | Status: DC
  Administered 2012-04-23 – 2012-04-25 (×8): 3.375 g via INTRAVENOUS
  Filled 2012-04-22 (×9): qty 50

## 2012-04-22 MED ORDER — TRAMADOL HCL 50 MG PO TABS
50.0000 mg | ORAL_TABLET | Freq: Three times a day (TID) | ORAL | Status: DC | PRN
  Administered 2012-04-22: 50 mg via ORAL
  Filled 2012-04-22: qty 1

## 2012-04-22 NOTE — H&P (Signed)
[name] [name] is an 79 y.o. female.   Chief Complaint: persistent abdominal pain that has been progressive and poo r micturition. HPI: [name] [name] was seen in the office Feb 6th for acute abdominal pain. Her exam was consistent with diverticulitis. Lab revealed WBC 17.3 with 81.4 segs, 10.8 lymphs. BNP 153. She was started on Augmentin 875 mg bid and supportive care.  Over the succeeding days she has had a problem with worsening weakness, decreased micturition, continued abdominal pain. She is not responding to outpatient treatment raising concern for possible abscess formation. She has been on steroids and MTX for management of RA making her a compromised patient.   She is now admitted for IV antibiotics, U/A, CT abd/pelvis.   Past Medical History  Diagnosis Date  . Unspecified cerebral artery occlusion with cerebral infarction   . Recurrent cold sores   . Xanthelasma of eyelid(374.51)   . Ganglion cyst   . Anatomical narrow angle borderline glaucoma   . Hypothyroidism   . Hypertension   . Atrial fibrillation   . Sjogren's syndrome   . Goiter   . Rheumatoid arthritis   . Osteoporosis     Past Surgical History  Procedure Laterality Date  . Appendectomy    . Vaginal cyst removed  1998     No family history on file. Social History:  reports that she has never smoked. She does not have any smokeless tobacco history on file. She reports that she does not drink alcohol or use illicit drugs. HSGMarried '43-29 years, widowed1 daughter- '56 adopted; 1 grandsonLives alone- I ADLSEnd of Life: still has packet at home, she has not made a decision (rediscussed 9/11)   Allergies:  Allergies  Allergen Reactions  . Hydroxychloroquine Sulfate     REACTION: rash   No current facility-administered medications on file prior to encounter.   Current Outpatient Prescriptions on File Prior to Encounter  Medication Sig Dispense Refill  . amoxicillin-clavulanate (AUGMENTIN) 875-125 MG per  tablet Take 1 tablet by mouth 2 (two) times daily.  20 tablet  0  . digoxin (LANOXIN) 0.125 MG tablet Take 1/2 tablet by mouth daily      . diltiazem (CARDIZEM CD) 240 MG 24 hr capsule take 1 capsule by mouth once daily  30 capsule  5  . folic acid (FOLVITE) 1 MG tablet Take 1 mg by mouth daily.        . hydrochlorothiazide (HYDRODIURIL) 25 MG tablet take 1/2 tablet by mouth once daily  30 tablet  6  . levothyroxine (SYNTHROID, LEVOTHROID) 75 MCG tablet take 1 tablet by mouth once daily  90 tablet  3  . methotrexate 2.5 MG tablet Take 15 mg by mouth once a week. Takes on Wednesday      . predniSONE (DELTASONE) 5 MG tablet Take 5 mg by mouth daily.        . traMADol (ULTRAM) 50 MG tablet Take 1 tablet (50 mg total) by mouth every 8 (eight) hours as needed for pain.  30 tablet  0  . warfarin (COUMADIN) 2.5 MG tablet Take 2.5 mg by mouth daily.      Marland Kitchen warfarin (COUMADIN) 5 MG tablet Take 1 tablet (5 mg total) by mouth daily. Take as directed by coumadin clinic  30 tablet  2    Lab Results  Component Value Date   WBC 17.3* 04/17/2012   HGB 15.3* 04/17/2012   HCT 45.9 04/17/2012   PLT 266.0 04/17/2012   GLUCOSE 123* 04/17/2012  CHOL  Value: 169        ATP III CLASSIFICATION:  <200     mg/dL   Desirable  960-454  mg/dL   Borderline High  >=098    mg/dL   High        01/18/1477   TRIG 111 12/16/2009   HDL 55 12/16/2009   LDLCALC  Value: 92        12/16/2009   ALT 20 04/17/2012   AST 23 04/17/2012   NA 140 04/17/2012   K 4.7 04/17/2012   CL 102 04/17/2012   CREATININE 0.7 04/17/2012   BUN 14 04/17/2012   CO2 31 04/17/2012   TSH 2.11 01/27/2010   INR 2.2 03/25/2012   HGBA1C  Value: 6.4  12/15/2009       pBNP                     153                                                                   04/17/2012   Review of Systems  Constitutional: Positive for chills and malaise/fatigue. Negative for weight loss.  HENT: Positive for hearing loss. Negative for nosebleeds, sore throat, neck pain and ear discharge.   Eyes:  Negative.   Respiratory: Positive for shortness of breath. Negative for cough, sputum production and wheezing.   Cardiovascular: Negative for chest pain, palpitations and claudication.  Gastrointestinal: Positive for nausea, abdominal pain and diarrhea. Negative for heartburn, vomiting and blood in stool.  Genitourinary: Negative.   Musculoskeletal: Negative for myalgias, back pain and falls.  Skin: Negative.   Neurological: Positive for weakness. Negative for dizziness, tingling, tremors, focal weakness and headaches.  Endo/Heme/Allergies: Negative for environmental allergies and polydipsia. Bruises/bleeds easily.  Psychiatric/Behavioral: Negative for depression and hallucinations. The patient is nervous/anxious.     There were no vitals taken for this visit. Physical Exam  Vitals - pending Gen'l - very elderly white woman in no acute distress but uncomfortable HEENT- [place]/AT, C&S clear w/o icterus, PERRLA Neck - supple Nodes - negative cervical, inguinal regions Cor  2+ radial pulse, RRR Pulm - normal respirations, Lungs CTAP Abdomen - hypoactive BS, no HSM, tender to deep palpation more at the RLQ but also LLQ, suprapubic tenderness, no guardng or rebound Ext - w/o deformity Neuro - A&O x 3, CN II-XII grossly normal Assessment/Plan 1. GI/ID - patient with probably diverticulitis with worsening pain despite 5 days of Augmentin. She is compromised by chronic steroid use and MTX. High level of concern for intrabdominal abscess as cause of persistent pain.  Plan Change to IV Zosyn - pharmacy to help dose given advanced age.  Labs: CBCD, Cmet  CT abd/pelvis with contrast r/o abscess formation  Full liquid diet for now  2. Cardiac - stable a. Fib on warfarin. Was hemodynamically stable on Feb 6th.  Plan Continue home meds   Continue warfarin tonight - will make changes if patient with any surgical indication.  Heart rate is stable.  3. RA - has been under treatment for flare. She  does not have any inflammed joints on exam.  Plan Continue prednisone for now and mtx as dosed at home  4. Code status - have discussed ACP  with her in the past but at this point she remains a full code.    [name] 04/22/2012, 5:42 PM

## 2012-04-22 NOTE — Telephone Encounter (Signed)
Spoke with pt concerning her difficulty to urinate and have a bowel movement. Pt stated she has been quite a few times to urinate, but not able to do very much at all, just a dribble. Pt has had two small bowel movements and is in a lot of discomfort when she tries to have a bm. Pt still feels "sore" in her abdominal area and has discomfort. Please advise to how to help the pt.

## 2012-04-22 NOTE — Telephone Encounter (Signed)
Was started on augmentin for diverticulitis. Glad that micturition has resumed. Give her a call to be sure she is oK: no retention or suprapubic pain. thanks

## 2012-04-22 NOTE — Progress Notes (Signed)
ANTIBIOTIC CONSULT NOTE - INITIAL  Pharmacy Consult for Zosyn Indication: Probable Diverticulitis   Allergies  Allergen Reactions  . Hydroxychloroquine Sulfate     REACTION: rash    Patient Measurements: Height: 5' 1.02" (155 cm) Weight: 111 lb 15.9 oz (50.8 kg) IBW/kg (Calculated) : 47.86  Vital Signs:   Intake/Output from previous day:   Intake/Output from this shift:    Labs: No results found for this basename: WBC, HGB, PLT, LABCREA, CREATININE,  in the last 72 hours Estimated Creatinine Clearance: 33.9 ml/min (by C-G formula based on Cr of 0.7). No results found for this basename: VANCOTROUGH, VANCOPEAK, VANCORANDOM, GENTTROUGH, GENTPEAK, GENTRANDOM, TOBRATROUGH, TOBRAPEAK, TOBRARND, AMIKACINPEAK, AMIKACINTROU, AMIKACIN,  in the last 72 hours   Microbiology: No results found for this or any previous visit (from the past 720 hour(s)).  Medical History: Past Medical History  Diagnosis Date  . Unspecified cerebral artery occlusion with cerebral infarction   . Recurrent cold sores   . Xanthelasma of eyelid(374.51)   . Ganglion cyst   . Anatomical narrow angle borderline glaucoma   . Hypothyroidism   . Hypertension   . Atrial fibrillation   . Sjogren's syndrome   . Goiter   . Rheumatoid arthritis   . Osteoporosis     Medications:  Scheduled:  . digoxin  0.0625 mg Oral Daily  . diltiazem  240 mg Oral Daily  . folic acid  1 mg Oral Daily  . hydrochlorothiazide  25 mg Oral Daily  . [START ON 04/23/2012] levothyroxine  75 mcg Oral QAC breakfast  . methotrexate  15 mg Oral Weekly  . piperacillin-tazobactam (ZOSYN)  IV  3.375 g Intravenous Once  . [START ON 04/23/2012] piperacillin-tazobactam (ZOSYN)  IV  3.375 g Intravenous Q8H  . predniSONE  5 mg Oral Daily  . warfarin  5 mg Oral Daily   Infusions:  . dextrose 5 % and 0.45% NaCl     PRN: acetaminophen, acetaminophen, iohexol, magnesium hydroxide, traMADol Assessment:  79 yo with probable diverticulitis  with worsening pain despite 5 days of Augmentin prior to admission  CMET/CBC pending     2/11 Blood cultures sent   Goal of Therapy:  Zosyn per renal function   Plan:  1.)  F/u with CBC and BMET 2.) If CrCl > 20 ml/min proceed with Zosyn 3.375 mg IV q8h as ordered.  Will f/u  Furman Trentman, Loma Messing PharmD Pager #: [phone] 6:25 PM 04/22/2012

## 2012-04-22 NOTE — Telephone Encounter (Signed)
Called [name] [name] and asked her about her ability to urinate. [name] [name] stated she realized that she had not been drinking enough water. She has urinated some. [name] [name] states she feels sore in her lower abdominal/bladder area. [name] [name] also states she has not been able to have a good bowel movement. Advised [name] [name] that it is probably because she has not been drinking enough. Advised her to try to drink as much as she can today. To call me between 2 and 3 to let me know how many times she has been able to urinate and if she has had a bowel movement. [name] [name] understood and is to call me then.

## 2012-04-23 DIAGNOSIS — K5732 Diverticulitis of large intestine without perforation or abscess without bleeding: Principal | ICD-10-CM

## 2012-04-23 NOTE — Progress Notes (Signed)
Subjective: Feeling better. Not having as much pain. No N/V  Objective: Lab: Lab Results  Component Value Date   WBC 19.7* 04/22/2012   HGB 14.9 04/22/2012   HCT 44.3 04/22/2012   MCV 94.1 04/22/2012   PLT 192 04/22/2012  Diff: 86/7/6 BMET    Component Value Date/Time   NA 131* 04/22/2012 1815   K 3.3* 04/22/2012 1815   CL 93* 04/22/2012 1815   CO2 28 04/22/2012 1815   GLUCOSE 124* 04/22/2012 1815   BUN 10 04/22/2012 1815   CREATININE 0.53 04/22/2012 1815   CALCIUM 8.8 04/22/2012 1815   GFRNONAA 80* 04/22/2012 1815   GFRAA >90 04/22/2012 1815     Imaging: CT Abd/pelvis: IMPRESSION:  1. Inflammatory changes involving the distal sigmoid colon may  represent diverticulitis, focal colitis, or neoplasm. Follow up  recommended.  2. No abscess or perforation.  3. Cholelithiasis.  Scheduled Meds: . digoxin  0.0625 mg Oral Daily  . diltiazem  240 mg Oral Daily  . folic acid  1 mg Oral Daily  . hydrochlorothiazide  25 mg Oral Daily  . levothyroxine  75 mcg Oral QAC breakfast  . methotrexate  15 mg Oral Weekly  . piperacillin-tazobactam (ZOSYN)  IV  3.375 g Intravenous Once  . piperacillin-tazobactam (ZOSYN)  IV  3.375 g Intravenous Q8H  . potassium chloride  20 mEq Oral BID  . predniSONE  5 mg Oral Daily  . warfarin  5 mg Oral q1800  . Warfarin - Physician Dosing Inpatient   Does not apply q1800   Continuous Infusions: . dextrose 5 % and 0.45% NaCl 75 mL/hr at 04/22/12 1900   PRN Meds:.acetaminophen, acetaminophen, magnesium hydroxide, traMADol   Physical Exam: Filed Vitals:   04/23/12 0557  BP: 131/42  Pulse: 68  Temp: 97.8 F (36.6 C)  Resp: 16    Intake/Output Summary (Last 24 hours) at 04/23/12 0842 Last data filed at 04/23/12 0836  Gross per 24 hour  Intake    240 ml  Output    900 ml  Net   -660 ml   Gen'l WNWD well preserved 79 y/o HEENT- C&S clear Cor - 2+ radial, RRR Pulm - normal respirations Abd- BS+ x 4, no guarding or rebound, tender in the LLQ Neuro  - A&O x 3     Assessment/Plan: 1. Diverticulitis - elevated WBC, CT evidence of inflammation, fortunately no abscess. Plan Continue Zosyn    [name] [place] [facility] (o) [phone]; (c) [phone] Call-grp - [facility]  Tele: [phone]  04/23/2012, 8:31 AM

## 2012-04-23 NOTE — Telephone Encounter (Signed)
admtted to hosp

## 2012-04-24 LAB — BASIC METABOLIC PANEL
CO2: 30 mEq/L (ref 19–32)
Calcium: 8.5 mg/dL (ref 8.4–10.5)
Chloride: 100 mEq/L (ref 96–112)
Potassium: 3.7 mEq/L (ref 3.5–5.1)
Sodium: 137 mEq/L (ref 135–145)

## 2012-04-24 LAB — CBC WITH DIFFERENTIAL/PLATELET
Basophils Absolute: 0 10*3/uL (ref 0.0–0.1)
Lymphocytes Relative: 15 % (ref 12–46)
Neutro Abs: 8.2 10*3/uL — ABNORMAL HIGH (ref 1.7–7.7)
Neutrophils Relative %: 73 % (ref 43–77)
Platelets: 213 10*3/uL (ref 150–400)
RDW: 15.9 % — ABNORMAL HIGH (ref 11.5–15.5)
WBC: 11.2 10*3/uL — ABNORMAL HIGH (ref 4.0–10.5)

## 2012-04-24 LAB — OCCULT BLOOD X 1 CARD TO LAB, STOOL: Fecal Occult Bld: NEGATIVE

## 2012-04-24 LAB — PROTIME-INR: INR: 3.02 — ABNORMAL HIGH (ref 0.00–1.49)

## 2012-04-24 MED ORDER — WARFARIN - PHARMACIST DOSING INPATIENT: Freq: Every day | Status: DC

## 2012-04-24 NOTE — Progress Notes (Signed)
ANTICOAGULATION CONSULT NOTE - Initial Consult  Pharmacy Consult for Coumadin Indication: atrial fibrillation  Allergies  Allergen Reactions  . Hydroxychloroquine Sulfate     REACTION: rash    Patient Measurements: Height: 5\' 1"  (154.9 cm) Weight: 110 lb 6.4 oz (50.077 kg) IBW/kg (Calculated) : 47.8 Vital Signs: Temp: 98 F (36.7 C) (02/13 0429) Temp src: Oral (02/13 0429) BP: 145/50 mmHg (02/13 0429) Pulse Rate: 61 (02/13 0429)  Labs:  Recent Labs  04/22/12 1815 04/22/12 1841 04/23/12 0350 04/24/12 0350  HGB 14.9  --   --  13.6  HCT 44.3  --   --  39.6  PLT 192  --   --  213  LABPROT  --  17.5* 19.8* 29.7*  INR  --  1.48 1.75* 3.02*  CREATININE 0.53  --   --  0.65    Estimated Creatinine Clearance: 33.9 ml/min (by C-G formula based on Cr of 0.65).   Medical History: Past Medical History  Diagnosis Date  . Unspecified cerebral artery occlusion with cerebral infarction   . Recurrent cold sores   . Xanthelasma of eyelid(374.51)   . Ganglion cyst   . Anatomical narrow angle borderline glaucoma   . Hypothyroidism   . Hypertension   . Atrial fibrillation   . Sjogren's syndrome   . Goiter   . Rheumatoid arthritis   . Osteoporosis     Medications:  Prescriptions prior to admission  Medication Sig Dispense Refill  . amoxicillin-clavulanate (AUGMENTIN) 875-125 MG per tablet Take 1 tablet by mouth 2 (two) times daily. 10 day course of therapy started 04/17/12.      . digoxin (LANOXIN) 0.125 MG tablet Take 0.0625 mg by mouth daily.      Marland Kitchen diltiazem (CARDIZEM CD) 240 MG 24 hr capsule Take 240 mg by mouth daily.      . folic acid (FOLVITE) 1 MG tablet Take 1 mg by mouth daily.        . hydrochlorothiazide (HYDRODIURIL) 25 MG tablet Take 12.5 mg by mouth daily.      Marland Kitchen levothyroxine (SYNTHROID, LEVOTHROID) 75 MCG tablet Take 75 mcg by mouth daily.      . methotrexate 2.5 MG tablet Take 15 mg by mouth once a week. Takes on Wednesday      . predniSONE (DELTASONE) 5 MG  tablet Take 5 mg by mouth daily.        . traMADol (ULTRAM) 50 MG tablet Take 1 tablet (50 mg total) by mouth every 8 (eight) hours as needed for pain.  30 tablet  0  . warfarin (COUMADIN) 5 MG tablet Take 2.5 mg by mouth daily.        Assessment: 89 YOF on coumadin PTA for hx Afib. Dose PTA 2.5mg  daily, last taken 2/11 PTA. INR on admission subtx (1.48). Most recent anticoag visit OP was 1/14/201, INR was 2.2 on 2.5mg  daily. MD dosing started day of admission, 5mg  daily ordered. Rec'd doses 2/11 and 2/12. Today INR is supratx and increased dramatically (1.75 >> 3.02). Paged Dr Debby Bud to see if he would like pharmacy to dose - he is agreeable to this plan  INR is likely elevated d/t total dose of 7.5mg  (2.5mg  PTA and 5mg  after admission) on 2/11 and increased dose   No bleeding reported  Goal of Therapy:  INR 2-3   Plan:  Daily INR No coumadin today  Gwen Her PharmD  04/24/2012 1:16 PM

## 2012-04-24 NOTE — Progress Notes (Signed)
Subjective: Feeling better. Has a good appetite. Minimal abdominal pain  Objective: Lab: Lab Results  Component Value Date   WBC 11.2* 04/24/2012   HGB 13.6 04/24/2012   HCT 39.6 04/24/2012   MCV 94.5 04/24/2012   PLT 213 04/24/2012   BMET    Component Value Date/Time   NA 137 04/24/2012 0350   K 3.7 04/24/2012 0350   CL 100 04/24/2012 0350   CO2 30 04/24/2012 0350   GLUCOSE 125* 04/24/2012 0350   BUN 10 04/24/2012 0350   CREATININE 0.65 04/24/2012 0350   CALCIUM 8.5 04/24/2012 0350   GFRNONAA 75* 04/24/2012 0350   GFRAA 87* 04/24/2012 0350     Imaging:  Scheduled Meds: . digoxin  0.0625 mg Oral Daily  . diltiazem  240 mg Oral Daily  . folic acid  1 mg Oral Daily  . hydrochlorothiazide  25 mg Oral Daily  . levothyroxine  75 mcg Oral QAC breakfast  . methotrexate  15 mg Oral Weekly  . piperacillin-tazobactam (ZOSYN)  IV  3.375 g Intravenous Once  . piperacillin-tazobactam (ZOSYN)  IV  3.375 g Intravenous Q8H  . predniSONE  5 mg Oral Daily  . warfarin  5 mg Oral q1800  . Warfarin - Physician Dosing Inpatient   Does not apply q1800   Continuous Infusions: . dextrose 5 % and 0.45% NaCl 50 mL/hr at 04/23/12 1824   PRN Meds:.acetaminophen, acetaminophen, magnesium hydroxide, traMADol   Physical Exam: Filed Vitals:   04/24/12 0429  BP: 145/50  Pulse: 61  Temp: 98 F (36.7 C)  Resp: 16  gen'l; - spry 79 y/o Cor - RRR Pulm - normal respirations Abd - BS+ , mild tenderness - worse LLQ Neuro A&O x3       Assessment/Plan: 1. Diverticulitis - Day #3 zosyn. WBC down. Good appetite  Plan Continue zosyn  Possible home tomorrow on or antibiotics.   [name]  IM (o) [phone]; (c) [phone] Call-grp - Patsi Sears IM  Tele: [phone]  04/24/2012, 12:05 PM

## 2012-04-25 LAB — CBC WITH DIFFERENTIAL/PLATELET
Eosinophils Relative: 3 % (ref 0–5)
HCT: 42.5 % (ref 36.0–46.0)
Lymphocytes Relative: 19 % (ref 12–46)
Lymphs Abs: 2 10*3/uL (ref 0.7–4.0)
MCV: 94.7 fL (ref 78.0–100.0)
Platelets: 275 10*3/uL (ref 150–400)
RBC: 4.49 MIL/uL (ref 3.87–5.11)
WBC: 10.9 10*3/uL — ABNORMAL HIGH (ref 4.0–10.5)

## 2012-04-25 LAB — PROTIME-INR: Prothrombin Time: 32 seconds — ABNORMAL HIGH (ref 11.6–15.2)

## 2012-04-25 NOTE — Progress Notes (Addendum)
ANTICOAGULATION CONSULT NOTE - Initial Consult  Pharmacy Consult for Coumadin Indication: atrial fibrillation  Allergies  Allergen Reactions  . Hydroxychloroquine Sulfate     REACTION: rash    Patient Measurements: Height: 5\' 1"  (154.9 cm) Weight: 110 lb 6.4 oz (50.077 kg) IBW/kg (Calculated) : 47.8 Vital Signs: Temp: 98 F (36.7 C) (02/14 0451) Temp src: Oral (02/14 0451) BP: 151/46 mmHg (02/14 0546) Pulse Rate: 54 (02/14 0451)  Labs:  Recent Labs  04/22/12 1815  04/23/12 0350 04/24/12 0350 04/25/12 0414  HGB 14.9  --   --  13.6 13.9  HCT 44.3  --   --  39.6 42.5  PLT 192  --   --  213 275  LABPROT  --   < > 19.8* 29.7* 32.0*  INR  --   < > 1.75* 3.02* 3.34*  CREATININE 0.53  --   --  0.65  --   < > = values in this interval not displayed.  Estimated Creatinine Clearance: 33.9 ml/min (by C-G formula based on Cr of 0.65).   Medical History: Past Medical History  Diagnosis Date  . Unspecified cerebral artery occlusion with cerebral infarction   . Recurrent cold sores   . Xanthelasma of eyelid(374.51)   . Ganglion cyst   . Anatomical narrow angle borderline glaucoma   . Hypothyroidism   . Hypertension   . Atrial fibrillation   . Sjogren's syndrome   . Goiter   . Rheumatoid arthritis   . Osteoporosis     Medications:  Prescriptions prior to admission  Medication Sig Dispense Refill  . amoxicillin-clavulanate (AUGMENTIN) 875-125 MG per tablet Take 1 tablet by mouth 2 (two) times daily. 10 day course of therapy started 04/17/12.      . digoxin (LANOXIN) 0.125 MG tablet Take 0.0625 mg by mouth daily.      Marland Kitchen diltiazem (CARDIZEM CD) 240 MG 24 hr capsule Take 240 mg by mouth daily.      . folic acid (FOLVITE) 1 MG tablet Take 1 mg by mouth daily.        . hydrochlorothiazide (HYDRODIURIL) 25 MG tablet Take 12.5 mg by mouth daily.      Marland Kitchen levothyroxine (SYNTHROID, LEVOTHROID) 75 MCG tablet Take 75 mcg by mouth daily.      . methotrexate 2.5 MG tablet Take 15 mg  by mouth once a week. Takes on Wednesday      . predniSONE (DELTASONE) 5 MG tablet Take 5 mg by mouth daily.        . traMADol (ULTRAM) 50 MG tablet Take 1 tablet (50 mg total) by mouth every 8 (eight) hours as needed for pain.  30 tablet  0  . warfarin (COUMADIN) 5 MG tablet Take 2.5 mg by mouth daily.        Assessment:  89 YOF on coumadin PTA for hx Afib, admitted with diverticulitis  Dose PTA 2.5mg  daily, last taken 2/11 PTA. INR on admission subtx (1.48). Most recent anticoag visit OP was 1/14/201, INR was 2.2 on 2.5mg  daily. MD dosing started day of admission, 5mg  daily ordered. Rec'd doses 2/11 and 2/12. Today INR is supratx and increased dramatically (1.75 >> 3.02). Paged Dr Debby Bud to see if he would like pharmacy to dose - he is agreeable to this plan   INR remains elevated and increased today d/t total dose of 7.5mg  (2.5mg  PTA and 5mg  after admission) on 2/11 and increased dose.  Also from Decreased PO intake secondary to diverticulitis.   No bleeding  reported, CBC stable   Goal of Therapy:  INR 2-3   Plan:  Daily INR No coumadin today **If patient discharged today, recommend holding warfarin Friday, would be Ideal to have INR checked on Saturday if possible, or ASAP given elevated INR; Also consider PO abx at discharge may have potential to increase INR*  Annalicia Renfrew, Loma Messing PharmD Pager #: [phone] 9:44 AM 04/25/2012

## 2012-04-25 NOTE — Care Management (Signed)
cxm spoke with pt's daughter rebecca via telephone per pt's permission at [phone]. Daughter confirmed assistance with pt's home care. Daughter did states having some concerns about pt's management of oral ABX upon discharge. CM informed daughter [facility] services for medication management could assist. Daughter states will make decision upon speaking with physician. Daughter provided with contact information for CM.   Roxy Manns Jatavia Keltner,RN,BSN [phone]

## 2012-04-25 NOTE — Progress Notes (Signed)
Patient given all discharge instructions.   Patient verbalized an understanding of all discharge instructions.  Patient and family were told to hold coumadin today, and resume home dose tomorrow.   Patient was also instructed to schedule and appointment on Monday to get PT/INR checked.  Philomena Doheny RN

## 2012-04-25 NOTE — Care Management (Addendum)
Cm spoke with patient concerning discharge planning. Pt lives alone, states being independent prior to hospital admittance. Per RN, pt has moved around well during hospital stay. Pt states having an adult daughter & adult son whom assist with home care. Per pt adult children take her to her Md appts & grocery shop for her. No PT Eval noted. Pt does not utilize home DME. No HH or DME needs stated. Pt's daughter to provide tx home upon discharge.

## 2012-04-25 NOTE — Progress Notes (Signed)
Subjective: Feeling OK. Good appetite. Normal micturition  Objective: Lab: Lab Results  Component Value Date   WBC 10.9* 04/25/2012   HGB 13.9 04/25/2012   HCT 42.5 04/25/2012   MCV 94.7 04/25/2012   PLT 275 04/25/2012   BMET    Component Value Date/Time   NA 137 04/24/2012 0350   K 3.7 04/24/2012 0350   CL 100 04/24/2012 0350   CO2 30 04/24/2012 0350   GLUCOSE 125* 04/24/2012 0350   BUN 10 04/24/2012 0350   CREATININE 0.65 04/24/2012 0350   CALCIUM 8.5 04/24/2012 0350   GFRNONAA 75* 04/24/2012 0350   GFRAA 87* 04/24/2012 0350     Imaging:  Scheduled Meds: . digoxin  0.0625 mg Oral Daily  . diltiazem  240 mg Oral Daily  . folic acid  1 mg Oral Daily  . hydrochlorothiazide  25 mg Oral Daily  . levothyroxine  75 mcg Oral QAC breakfast  . methotrexate  15 mg Oral Weekly  . piperacillin-tazobactam (ZOSYN)  IV  3.375 g Intravenous Once  . piperacillin-tazobactam (ZOSYN)  IV  3.375 g Intravenous Q8H  . predniSONE  5 mg Oral Daily  . Warfarin - Pharmacist Dosing Inpatient   Does not apply q1800   Continuous Infusions: . dextrose 5 % and 0.45% NaCl 50 mL/hr at 04/23/12 1824   PRN Meds:.acetaminophen, acetaminophen, magnesium hydroxide, traMADol   Physical Exam: Filed Vitals:   04/25/12 1017  BP: 122/50  Pulse: 70  Temp:   Resp:     See d/c/ summary    Assessment/Plan: For d/c home. Dictated #[id number]   [name] [name] IM (o) [phone]; (c) [phone] Call-grp - Patsi Sears IM  Tele: [phone]  04/25/2012, 12:01 PM

## 2012-04-25 NOTE — Discharge Summary (Signed)
NAME:  [name] [name], [name] [name]             ACCOUNT NO.:  [id number]  MEDICAL RECORD NO.:  [id number]  LOCATION:  1336                         FACILITY:  [facility]  PHYSICIAN:  Rosalyn Gess. Anays Detore, MD  DATE OF BIRTH:  December 28, 1918  DATE OF ADMISSION:  04/22/2012 DATE OF DISCHARGE:  04/25/2012                              DISCHARGE SUMMARY   ADMITTING DIAGNOSIS:  Diverticulitis.  DISCHARGE DIAGNOSIS:  Diverticulitis.  CONSULTANTS:  None.  PROCEDURES:  CT scan of the abdomen and pelvis which showed inflammatory changes involving the distal sigmoid colon, representing diverticulitis, focal colitis, or neoplasm.  No abscess or perforation was noted or cholelithiasis was noted.  HISTORY OF PRESENT ILLNESS:  Ms. Gaspar presented to the office on February 6 for acute abdominal pain.  Her examination was consistent with diverticulitis.  White count at that time was 17,300, with 81.4% segs.  She was started on Augmentin 875 mg b.i.d. and supportive care. Over the succeeding days, she had increasing problems with weakness, decreased micturition, and continued abdominal pain.  She did not appear to be responding to outpatient treatment and was admitted for IV antibiotics and further evaluations for diverticulitis and abscess.  The patient was at risk to being on steroids and methotrexate for rheumatoid arthritis, making her compromised.  Please see the H and P for past medical history, family history, social history, and admission physical exam.  HOSPITAL COURSE:  Diverticulitis.  The patient was started on IV Zosyn. On this regimen, she improved.  Her white blood count returned to a normal level.  She had a good appetite.  She did remain somewhat tender in a left quadrant of her abdomen.  With the patient's cell white count being normal, with no fever, with her having a good appetite, with her abdominal pain being improved, with normal micturition, at this point she is ready for discharge to  home. The patient will continue on all of her prior medications.  She will complete her prescription of Augmentin to complete her antibiotic therapy.  The patient will be seen in followup in 7-10 days.  If she continues to have discomfort in a left lower quadrant, but remains afebrile with normal white count, may need to consider colonoscopy.  DISCHARGE EXAMINATION:  VITAL SIGNS:  Temperature was 98, blood pressure 122/50, pulse was 70, respirations 18, oxygen saturations 96%. GENERAL APPEARANCE:  This is a well-nourished, well-developed, spry- appearing 79 year old, looking younger than her chronologic age. HEENT:  Conjunctiva sclerae were clear. NECK:  Supple. CHEST:  The patient is moving air well with no rales, wheezes, or rhonchi. CARDIOVASCULAR:  2+ radial pulses.  She has regular rate and rhythm. ABDOMEN:  The patient has positive bowel sounds in all 4 quadrants.  She has no guarding or rebound.  She has mild tenderness to deep palpation in both lower quadrants left greater than right.  FINAL LABORATORY:  CBC from the day of discharge, her hemoglobin of 13.9 g, white count was 10,900, with 73% segs, 19% lymphs, 5% monos, platelet count 275,000.  Final chemistries from February 13 revealed a sodium 137, potassium 3.7, chloride 100, CO2 of 30, BUN 10, creatinine 0.65, glucose was 125.  DISPOSITION:  The patient is discharged  to home.  Her family will provide transportation.  FOLLOWUP:  The patient will be seen in 7-10 days in the office, will call with an appointment.  The patient's condition at the time of discharge dictation is stable and improved.     Rosalyn Gess Francy Mcilvaine, MD     MEN/MEDQ  D:  04/25/2012  T:  04/25/2012  Job:  [id number]

## 2012-04-28 LAB — CULTURE, BLOOD (ROUTINE X 2): Culture: NO GROWTH

## 2012-04-29 ENCOUNTER — Ambulatory Visit (INDEPENDENT_AMBULATORY_CARE_PROVIDER_SITE_OTHER): Payer: Medicare Other | Admitting: Internal Medicine

## 2012-04-29 ENCOUNTER — Encounter: Payer: Self-pay | Admitting: Internal Medicine

## 2012-04-29 ENCOUNTER — Ambulatory Visit (INDEPENDENT_AMBULATORY_CARE_PROVIDER_SITE_OTHER): Payer: Medicare Other | Admitting: General Practice

## 2012-04-29 VITALS — BP 120/68 | HR 87 | Temp 97.0°F | Resp 10 | Wt 111.0 lb

## 2012-04-29 DIAGNOSIS — K5732 Diverticulitis of large intestine without perforation or abscess without bleeding: Secondary | ICD-10-CM

## 2012-04-29 DIAGNOSIS — Z7901 Long term (current) use of anticoagulants: Secondary | ICD-10-CM

## 2012-04-29 DIAGNOSIS — I4891 Unspecified atrial fibrillation: Secondary | ICD-10-CM

## 2012-04-29 DIAGNOSIS — I6350 Cerebral infarction due to unspecified occlusion or stenosis of unspecified cerebral artery: Secondary | ICD-10-CM

## 2012-04-29 LAB — POCT INR: INR: 2

## 2012-04-30 NOTE — Progress Notes (Signed)
  Subjective:    Patient ID: [name] [name], female    DOB: Aug 18, 1918, 79 y.o.   MRN: [id number]  HPI Mrs. Savidge was recently diagnosed with diverticulitis. She failed outpatient therapy and was hospitalized for IV antibiotics and IV hydration. She did very well and was discharged to home to complete oral antibiotics.  Since discharge she has done well: resumed a normal diet, hydrated, has had normal bowel habit. She has had no pain or feverl.  PMH, FamHx and SocHx reviewed for any changes and relevance. Current Outpatient Prescriptions on File Prior to Visit  Medication Sig Dispense Refill  . amoxicillin-clavulanate (AUGMENTIN) 875-125 MG per tablet Take 1 tablet by mouth 2 (two) times daily. 10 day course of therapy started 04/17/12.      . digoxin (LANOXIN) 0.125 MG tablet Take 0.0625 mg by mouth daily.      Marland Kitchen diltiazem (CARDIZEM CD) 240 MG 24 hr capsule Take 240 mg by mouth daily.      . folic acid (FOLVITE) 1 MG tablet Take 1 mg by mouth daily.        . hydrochlorothiazide (HYDRODIURIL) 25 MG tablet Take 12.5 mg by mouth daily.      Marland Kitchen levothyroxine (SYNTHROID, LEVOTHROID) 75 MCG tablet Take 75 mcg by mouth daily.      . methotrexate 2.5 MG tablet Take 15 mg by mouth once a week. Takes on Wednesday      . predniSONE (DELTASONE) 5 MG tablet Take 5 mg by mouth daily.        . traMADol (ULTRAM) 50 MG tablet Take 1 tablet (50 mg total) by mouth every 8 (eight) hours as needed for pain.  30 tablet  0  . warfarin (COUMADIN) 5 MG tablet Take 2.5 mg by mouth daily.       No current facility-administered medications on file prior to visit.      Review of Systems System review is negative for any constitutional, cardiac, pulmonary, GI or neuro symptoms or complaints other than as described in the HPI.     Objective:   Physical Exam Filed Vitals:   04/29/12 1114  BP: 120/68  Pulse: 87  Temp: 97 F (36.1 C)  Resp: 10   Wt Readings from Last 3 Encounters:  04/29/12 111 lb (50.349  kg)  04/24/12 110 lb 6.4 oz (50.077 kg)  04/17/12 112 lb (50.803 kg)   Gen'l - very elderly white woman in no distress HEENT_ C&S clear w/o icterus Cor- RRR Pulm - normal respirations Abd - BS +, soft, no guarding or rebound, no tenderness-especially LLQ       Assessment & Plan:  Diverticulitis - resolved.

## 2012-05-23 ENCOUNTER — Other Ambulatory Visit: Payer: Self-pay | Admitting: *Deleted

## 2012-05-23 MED ORDER — HYDROCHLOROTHIAZIDE 25 MG PO TABS: 12.5000 mg | ORAL_TABLET | Freq: Every day | ORAL | Status: DC

## 2012-06-03 ENCOUNTER — Ambulatory Visit (INDEPENDENT_AMBULATORY_CARE_PROVIDER_SITE_OTHER): Payer: Medicare Other | Admitting: General Practice

## 2012-06-03 DIAGNOSIS — Z7901 Long term (current) use of anticoagulants: Secondary | ICD-10-CM

## 2012-06-03 DIAGNOSIS — I6350 Cerebral infarction due to unspecified occlusion or stenosis of unspecified cerebral artery: Secondary | ICD-10-CM

## 2012-06-03 DIAGNOSIS — I4891 Unspecified atrial fibrillation: Secondary | ICD-10-CM

## 2012-07-16 ENCOUNTER — Ambulatory Visit (INDEPENDENT_AMBULATORY_CARE_PROVIDER_SITE_OTHER): Payer: Medicare Other | Admitting: General Practice

## 2012-07-16 DIAGNOSIS — I4891 Unspecified atrial fibrillation: Secondary | ICD-10-CM

## 2012-07-16 DIAGNOSIS — Z7901 Long term (current) use of anticoagulants: Secondary | ICD-10-CM

## 2012-07-16 DIAGNOSIS — I6350 Cerebral infarction due to unspecified occlusion or stenosis of unspecified cerebral artery: Secondary | ICD-10-CM

## 2012-07-16 LAB — POCT INR: INR: 1.8

## 2012-07-28 ENCOUNTER — Other Ambulatory Visit: Payer: Self-pay | Admitting: Internal Medicine

## 2012-08-13 ENCOUNTER — Ambulatory Visit (INDEPENDENT_AMBULATORY_CARE_PROVIDER_SITE_OTHER): Payer: Medicare Other | Admitting: Family Medicine

## 2012-08-13 DIAGNOSIS — I6350 Cerebral infarction due to unspecified occlusion or stenosis of unspecified cerebral artery: Secondary | ICD-10-CM

## 2012-08-13 DIAGNOSIS — Z7901 Long term (current) use of anticoagulants: Secondary | ICD-10-CM

## 2012-08-13 DIAGNOSIS — I4891 Unspecified atrial fibrillation: Secondary | ICD-10-CM

## 2012-08-13 LAB — POCT INR: INR: 2.6

## 2012-08-27 ENCOUNTER — Other Ambulatory Visit: Payer: Self-pay | Admitting: Internal Medicine

## 2012-09-19 ENCOUNTER — Ambulatory Visit (INDEPENDENT_AMBULATORY_CARE_PROVIDER_SITE_OTHER): Payer: Medicare Other | Admitting: General Practice

## 2012-09-19 DIAGNOSIS — Z7901 Long term (current) use of anticoagulants: Secondary | ICD-10-CM

## 2012-09-19 DIAGNOSIS — I6350 Cerebral infarction due to unspecified occlusion or stenosis of unspecified cerebral artery: Secondary | ICD-10-CM

## 2012-09-19 DIAGNOSIS — I4891 Unspecified atrial fibrillation: Secondary | ICD-10-CM

## 2012-10-17 ENCOUNTER — Ambulatory Visit (INDEPENDENT_AMBULATORY_CARE_PROVIDER_SITE_OTHER): Payer: Medicare Other | Admitting: General Practice

## 2012-10-17 DIAGNOSIS — I6350 Cerebral infarction due to unspecified occlusion or stenosis of unspecified cerebral artery: Secondary | ICD-10-CM

## 2012-10-17 DIAGNOSIS — Z7901 Long term (current) use of anticoagulants: Secondary | ICD-10-CM

## 2012-10-17 DIAGNOSIS — I4891 Unspecified atrial fibrillation: Secondary | ICD-10-CM

## 2012-11-14 ENCOUNTER — Ambulatory Visit (INDEPENDENT_AMBULATORY_CARE_PROVIDER_SITE_OTHER): Payer: Medicare Other | Admitting: General Practice

## 2012-11-14 DIAGNOSIS — I4891 Unspecified atrial fibrillation: Secondary | ICD-10-CM

## 2012-11-14 DIAGNOSIS — I6350 Cerebral infarction due to unspecified occlusion or stenosis of unspecified cerebral artery: Secondary | ICD-10-CM

## 2012-11-14 DIAGNOSIS — Z7901 Long term (current) use of anticoagulants: Secondary | ICD-10-CM

## 2012-11-14 LAB — POCT INR: INR: 1.4

## 2012-11-27 ENCOUNTER — Ambulatory Visit: Payer: Medicare Other | Admitting: Internal Medicine

## 2012-11-28 ENCOUNTER — Ambulatory Visit (INDEPENDENT_AMBULATORY_CARE_PROVIDER_SITE_OTHER): Payer: Medicare Other | Admitting: General Practice

## 2012-11-28 DIAGNOSIS — Z7901 Long term (current) use of anticoagulants: Secondary | ICD-10-CM

## 2012-11-28 DIAGNOSIS — I6350 Cerebral infarction due to unspecified occlusion or stenosis of unspecified cerebral artery: Secondary | ICD-10-CM

## 2012-11-28 DIAGNOSIS — I4891 Unspecified atrial fibrillation: Secondary | ICD-10-CM

## 2012-12-01 ENCOUNTER — Ambulatory Visit (INDEPENDENT_AMBULATORY_CARE_PROVIDER_SITE_OTHER): Payer: Medicare Other | Admitting: Internal Medicine

## 2012-12-01 ENCOUNTER — Encounter: Payer: Self-pay | Admitting: Internal Medicine

## 2012-12-01 VITALS — BP 140/62 | HR 72 | Temp 97.7°F | Wt 113.0 lb

## 2012-12-01 DIAGNOSIS — H0260 Xanthelasma of unspecified eye, unspecified eyelid: Secondary | ICD-10-CM

## 2012-12-01 DIAGNOSIS — M069 Rheumatoid arthritis, unspecified: Secondary | ICD-10-CM

## 2012-12-01 DIAGNOSIS — I4891 Unspecified atrial fibrillation: Secondary | ICD-10-CM

## 2012-12-01 DIAGNOSIS — E039 Hypothyroidism, unspecified: Secondary | ICD-10-CM

## 2012-12-01 DIAGNOSIS — Z23 Encounter for immunization: Secondary | ICD-10-CM

## 2012-12-01 DIAGNOSIS — R55 Syncope and collapse: Secondary | ICD-10-CM

## 2012-12-01 MED ORDER — LEVOTHYROXINE SODIUM 75 MCG PO TABS: 75.0000 ug | ORAL_TABLET | Freq: Every day | ORAL | Status: DC

## 2012-12-01 NOTE — Progress Notes (Signed)
Subjective:    Patient ID: [name] [name], female    DOB: 07-08-1918, 79 y.o.   MRN: [id number]  HPI Needs synthroid.  Weight loss - she is concerned about progressive wieght loss. Protruding globe OD - possibility of retracted eyelid Syncope - she has had several episodes of sudden loss of consciousness with a fall. She has been paying attention and has not noticed an prodromal symptoms. Last episode with a fall was April '14 Mobility - she is using a stnd walker. She is encouraged to use a rolling walker - less work and safer.   Past Medical History  Diagnosis Date  . Unspecified cerebral artery occlusion with cerebral infarction   . Recurrent cold sores   . Xanthelasma of eyelid(374.51)   . Ganglion cyst   . Anatomical narrow angle borderline glaucoma   . Hypothyroidism   . Hypertension   . Atrial fibrillation   . Sjogren's syndrome   . Goiter   . Rheumatoid arthritis(714.0)   . Osteoporosis    Past Surgical History  Procedure Laterality Date  . Appendectomy    . Vaginal cyst removed  1998    History reviewed. No pertinent family history. History   Social History  . Marital Status: Single    Spouse Name: N/A    Number of Children: N/A  . Years of Education: N/A   Occupational History  . Not on file.   Social History Main Topics  . Smoking status: Never Smoker   . Smokeless tobacco: Not on file  . Alcohol Use: No  . Drug Use: No  . Sexual Activity: Not Currently   Other Topics Concern  . Not on file   Social History Narrative   HSG   Married '43-29 years, widowed   1 daughter- '56 adopted; 1 grandson   Lives alone- I ADLS   End of Life: still has packet at home, she has not made a decision (rediscussed 9/11)    Current Outpatient Prescriptions on File Prior to Visit  Medication Sig Dispense Refill  . digoxin (LANOXIN) 0.125 MG tablet Take 0.0625 mg by mouth daily.      Marland Kitchen diltiazem (CARDIZEM CD) 240 MG 24 hr capsule TAKE ONE CAPSULE BY MOUTH EVERY  DAY  30 capsule  3  . folic acid (FOLVITE) 1 MG tablet Take 1 mg by mouth daily.        . hydrochlorothiazide (HYDRODIURIL) 25 MG tablet Take 0.5 tablets (12.5 mg total) by mouth daily.  15 tablet  11  . methotrexate 2.5 MG tablet Take 15 mg by mouth once a week. Takes on Wednesday      . predniSONE (DELTASONE) 5 MG tablet Take 5 mg by mouth daily.         No current facility-administered medications on file prior to visit.      Review of Systems System review is negative for any constitutional, cardiac, pulmonary, GI or neuro symptoms or complaints other than as described in the HPI.     Objective:   Physical Exam Filed Vitals:   12/01/12 1314  BP: 140/62  Pulse: 72  Temp: 97.7 F (36.5 C)   Gen'l - elderly woman who seems spry. HEENT- C&S normal, globes appear equal in size. From side view there is no protrusion of the right globe. There is a slightly raised right upper eyelid with a small lesion at the medial aspect of the lid. Cor- RRR Pulm - normal  Neuro - A&O x 3, speech clear, cognition  and memory seems normal. Able to stand with minimal assistance. Able to ambulate with use of a walker.      Assessment & Plan:  Weight loss - reviewed and printed weight flow sheet for 2 years. Her weight has been stable for the last year. Her BMI is in normal range.

## 2012-12-01 NOTE — Patient Instructions (Addendum)
1. Weight loss -  concern about progressive wieght loss - stable over the last year (see table of weights). Body Mass Index (BMI) is in normal range.  2. Protruding globe OD - possibility of retracted eyelid. No protuberance noted on exam.  3. Syncope -  several episodes of sudden loss of consciousness with a fall. Continue paying attention and notice any prodromal symptoms. Last episode with a fall was April '14  Plan - remember the "Rule of 20."  4. Mobility - using a standard walker. Please consider using a rolling walker - less work and safer.   We will refill your synthroid.

## 2012-12-02 DIAGNOSIS — R55 Syncope and collapse: Secondary | ICD-10-CM | POA: Insufficient documentation

## 2012-12-02 NOTE — Assessment & Plan Note (Signed)
Patient report of several episode of collapse w/o warning. She has had injury. These events are infrequent with the last episode about 4 months ago. She denies any prodrome or warning signs. Events have not been witnessed. No report of neuro sequale- incontinence, post-ictal period. Concern is for arrythmia related episodes.  Discussed with patient and her daughter. With infrequent episodes cardiac monitoring is difficult. One possibility would be an implantable monitor. However, given her age and co morbidities will hold off on this at this time.

## 2012-12-02 NOTE — Assessment & Plan Note (Signed)
Last visit to Dr. Dierdre Forth July 2, '14: MTX continued, CBC normal. X-ray with OA hip. She does have pain with walking and is a little unstable.  Plan Strongly encouraged use of rolling walker to reduce work of movement, especially shoulders.

## 2012-12-02 NOTE — Assessment & Plan Note (Signed)
Lab Results  Component Value Date   TSH 2.11 01/27/2010   Plan Renewed Rx  She will be asked to come by for TSH at her convenience

## 2012-12-02 NOTE — Assessment & Plan Note (Signed)
May be causing change in level of eyelid OD, but she does have good blink and closure, C&S does not appear dry. Globe is normal size and equal to OS

## 2012-12-02 NOTE — Assessment & Plan Note (Signed)
Regular pulse at today's exam. No EKG done

## 2012-12-26 ENCOUNTER — Ambulatory Visit (INDEPENDENT_AMBULATORY_CARE_PROVIDER_SITE_OTHER): Payer: Medicare Other | Admitting: General Practice

## 2012-12-26 DIAGNOSIS — Z7901 Long term (current) use of anticoagulants: Secondary | ICD-10-CM

## 2012-12-26 DIAGNOSIS — I4891 Unspecified atrial fibrillation: Secondary | ICD-10-CM

## 2012-12-26 DIAGNOSIS — I6350 Cerebral infarction due to unspecified occlusion or stenosis of unspecified cerebral artery: Secondary | ICD-10-CM

## 2013-01-02 ENCOUNTER — Ambulatory Visit (INDEPENDENT_AMBULATORY_CARE_PROVIDER_SITE_OTHER)
Admission: RE | Admit: 2013-01-02 | Discharge: 2013-01-02 | Disposition: A | Discharge status: Home / Self Care | Payer: Medicare Other | Source: Ambulatory Visit | Attending: Internal Medicine | Admitting: Internal Medicine

## 2013-01-02 ENCOUNTER — Ambulatory Visit (INDEPENDENT_AMBULATORY_CARE_PROVIDER_SITE_OTHER): Payer: Medicare Other | Admitting: Internal Medicine

## 2013-01-02 ENCOUNTER — Encounter: Payer: Self-pay | Admitting: Internal Medicine

## 2013-01-02 VITALS — BP 128/62 | HR 82 | Temp 98.2°F | Wt 111.0 lb

## 2013-01-02 DIAGNOSIS — R071 Chest pain on breathing: Secondary | ICD-10-CM

## 2013-01-02 DIAGNOSIS — I10 Essential (primary) hypertension: Secondary | ICD-10-CM

## 2013-01-02 DIAGNOSIS — R0781 Pleurodynia: Secondary | ICD-10-CM | POA: Insufficient documentation

## 2013-01-02 NOTE — Progress Notes (Signed)
Subjective:    Patient ID: [name] [name], female    DOB: 07/20/1918, 79 y.o.   MRN: [id number]  HPI  Here with daughter, c/o new mild to mod onset right upper parasternal sharp pleuritic pain, worse with deep breaths, onset last night with trying to sleep, worse to lie on right side last night but not really worse with head, neck, shoulder movements.  No back pain, HA, ST, cough and Pt denies other chest pain, increased sob or doe, wheezing, orthopnea, PND, increased LE swelling, palpitations, dizziness or syncope. No radiation, n/v.  No fever or worsening back pain.  On daily prednisone, coumadin Past Medical History  Diagnosis Date  . Unspecified cerebral artery occlusion with cerebral infarction   . Recurrent cold sores   . Xanthelasma of eyelid(374.51)   . Ganglion cyst   . Anatomical narrow angle borderline glaucoma   . Hypothyroidism   . Hypertension   . Atrial fibrillation   . Sjogren's syndrome   . Goiter   . Rheumatoid arthritis(714.0)   . Osteoporosis    Past Surgical History  Procedure Laterality Date  . Appendectomy    . Vaginal cyst removed  1998     reports that she has never smoked. She does not have any smokeless tobacco history on file. She reports that she does not drink alcohol or use illicit drugs. family history is not on file. Allergies  Allergen Reactions  . Hydroxychloroquine Sulfate     REACTION: rash   Current Outpatient Prescriptions on File Prior to Visit  Medication Sig Dispense Refill  . digoxin (LANOXIN) 0.125 MG tablet Take 0.0625 mg by mouth daily.      Marland Kitchen diltiazem (CARDIZEM CD) 240 MG 24 hr capsule TAKE ONE CAPSULE BY MOUTH EVERY DAY  30 capsule  3  . folic acid (FOLVITE) 1 MG tablet Take 1 mg by mouth daily.        . hydrochlorothiazide (HYDRODIURIL) 25 MG tablet Take 0.5 tablets (12.5 mg total) by mouth daily.  15 tablet  11  . levothyroxine (SYNTHROID, LEVOTHROID) 75 MCG tablet Take 1 tablet (75 mcg total) by mouth daily.  30 tablet  5  .  methotrexate 2.5 MG tablet Take 15 mg by mouth once a week. Takes on Wednesday      . predniSONE (DELTASONE) 5 MG tablet Take 5 mg by mouth daily.        Marland Kitchen warfarin (COUMADIN) 5 MG tablet        No current facility-administered medications on file prior to visit.   Review of Systems  Constitutional: Negative for unexpected weight change, or unusual diaphoresis  HENT: Negative for tinnitus.   Eyes: Negative for photophobia and visual disturbance.  Respiratory: Negative for choking and stridor.   Gastrointestinal: Negative for vomiting and blood in stool.  Genitourinary: Negative for hematuria and decreased urine volume.  Musculoskeletal: Negative for acute joint swelling Skin: Negative for color change and wound.  Neurological: Negative for tremors and numbness other than noted  Psychiatric/Behavioral: Negative for decreased concentration or  hyperactivity.       Objective:   Physical Exam BP 128/62  Pulse 82  Temp(Src) 98.2 F (36.8 C) (Oral)  Wt 111 lb (50.349 kg)  BMI 20.98 kg/m2  SpO2 96% VS noted,  Constitutional: Pt appears well-developed and well-nourished.  HENT: Head: NCAT.  Right Ear: External ear normal.  Left Ear: External ear normal.  Eyes: Conjunctivae and EOM are normal. Pupils are equal, round, and reactive to light.  Neck: Normal range of motion. Neck supple.  Cardiovascular: Normal rate and irregular rhythm.   Pulmonary/Chest: Effort normal and breath sounds normal.  Abd:  Soft, NT, non-distended, + BS Neurological: Pt is alert. Not confused  Skin: Skin is warm. No erythema.  Chest well  - nontender Psychiatric: Pt behavior is normal. Thought content normal.     Assessment & Plan:

## 2013-01-02 NOTE — Assessment & Plan Note (Addendum)
ECG reviewed as per emr, suspect MSK/chest wall pain though not able to reproduce on exam , also for cxr, but if neg for acute ok for pain control,  to f/u any worsening symptoms or concerns

## 2013-01-02 NOTE — Patient Instructions (Signed)
Please continue all other medications as before, and refills have been done if requested.  Please go to the XRAY Department in the Basement (go straight as you get off the elevator) for the x-ray testing You will be contacted by phone if any changes need to be made immediately.  Otherwise, you will receive a letter about your results with an explanation, but please check with MyChart first.   Please return to the front desk at 1PM for the EKG

## 2013-01-04 NOTE — Assessment & Plan Note (Signed)
stable overall by history and exam, recent data reviewed with pt, and pt to continue medical treatment as before,  to f/u any worsening symptoms or concerns BP Readings from Last 3 Encounters:  01/02/13 128/62  12/01/12 140/62  04/29/12 120/68

## 2013-01-13 ENCOUNTER — Other Ambulatory Visit: Payer: Self-pay | Admitting: Internal Medicine

## 2013-01-15 ENCOUNTER — Other Ambulatory Visit: Payer: Self-pay | Admitting: Internal Medicine

## 2013-01-28 ENCOUNTER — Ambulatory Visit (INDEPENDENT_AMBULATORY_CARE_PROVIDER_SITE_OTHER): Payer: Medicare Other | Admitting: General Practice

## 2013-01-28 ENCOUNTER — Other Ambulatory Visit: Payer: Self-pay | Admitting: Internal Medicine

## 2013-01-28 DIAGNOSIS — I6350 Cerebral infarction due to unspecified occlusion or stenosis of unspecified cerebral artery: Secondary | ICD-10-CM

## 2013-01-28 DIAGNOSIS — I4891 Unspecified atrial fibrillation: Secondary | ICD-10-CM

## 2013-01-28 DIAGNOSIS — Z7901 Long term (current) use of anticoagulants: Secondary | ICD-10-CM

## 2013-01-28 LAB — POCT INR: INR: 1.8

## 2013-01-28 NOTE — Progress Notes (Signed)
Pre-visit discussion using our clinic review tool. No additional management support is needed unless otherwise documented below in the visit note.  

## 2013-03-10 ENCOUNTER — Ambulatory Visit (INDEPENDENT_AMBULATORY_CARE_PROVIDER_SITE_OTHER): Payer: Medicare Other | Admitting: General Practice

## 2013-03-10 DIAGNOSIS — I6350 Cerebral infarction due to unspecified occlusion or stenosis of unspecified cerebral artery: Secondary | ICD-10-CM

## 2013-03-10 DIAGNOSIS — I4891 Unspecified atrial fibrillation: Secondary | ICD-10-CM

## 2013-03-10 DIAGNOSIS — Z7901 Long term (current) use of anticoagulants: Secondary | ICD-10-CM

## 2013-03-10 NOTE — Progress Notes (Signed)
Pre-visit discussion using our clinic review tool. No additional management support is needed unless otherwise documented below in the visit note.  

## 2013-04-09 ENCOUNTER — Other Ambulatory Visit: Payer: Self-pay | Admitting: Internal Medicine

## 2013-04-21 ENCOUNTER — Ambulatory Visit (INDEPENDENT_AMBULATORY_CARE_PROVIDER_SITE_OTHER): Payer: Medicare Other | Admitting: General Practice

## 2013-04-21 DIAGNOSIS — Z5181 Encounter for therapeutic drug level monitoring: Secondary | ICD-10-CM

## 2013-04-21 DIAGNOSIS — I6350 Cerebral infarction due to unspecified occlusion or stenosis of unspecified cerebral artery: Secondary | ICD-10-CM

## 2013-04-21 DIAGNOSIS — I4891 Unspecified atrial fibrillation: Secondary | ICD-10-CM

## 2013-04-21 LAB — POCT INR: INR: 1.9

## 2013-04-21 NOTE — Progress Notes (Signed)
Pre-visit discussion using our clinic review tool. No additional management support is needed unless otherwise documented below in the visit note.  

## 2013-05-23 ENCOUNTER — Other Ambulatory Visit: Payer: Self-pay | Admitting: Internal Medicine

## 2013-05-27 ENCOUNTER — Other Ambulatory Visit (INDEPENDENT_AMBULATORY_CARE_PROVIDER_SITE_OTHER): Payer: Medicare Other

## 2013-05-27 ENCOUNTER — Ambulatory Visit (INDEPENDENT_AMBULATORY_CARE_PROVIDER_SITE_OTHER): Payer: Medicare Other | Admitting: Internal Medicine

## 2013-05-27 ENCOUNTER — Encounter: Payer: Self-pay | Admitting: Internal Medicine

## 2013-05-27 ENCOUNTER — Ambulatory Visit (INDEPENDENT_AMBULATORY_CARE_PROVIDER_SITE_OTHER): Payer: Medicare Other | Admitting: General Practice

## 2013-05-27 ENCOUNTER — Telehealth: Payer: Self-pay | Admitting: Internal Medicine

## 2013-05-27 VITALS — BP 140/70 | HR 81 | Temp 96.4°F | Wt 106.1 lb

## 2013-05-27 DIAGNOSIS — I10 Essential (primary) hypertension: Secondary | ICD-10-CM

## 2013-05-27 DIAGNOSIS — I4891 Unspecified atrial fibrillation: Secondary | ICD-10-CM

## 2013-05-27 DIAGNOSIS — Z7901 Long term (current) use of anticoagulants: Secondary | ICD-10-CM

## 2013-05-27 DIAGNOSIS — Z5181 Encounter for therapeutic drug level monitoring: Secondary | ICD-10-CM

## 2013-05-27 DIAGNOSIS — I6350 Cerebral infarction due to unspecified occlusion or stenosis of unspecified cerebral artery: Secondary | ICD-10-CM

## 2013-05-27 DIAGNOSIS — E039 Hypothyroidism, unspecified: Secondary | ICD-10-CM

## 2013-05-27 DIAGNOSIS — Z23 Encounter for immunization: Secondary | ICD-10-CM

## 2013-05-27 LAB — CBC WITH DIFFERENTIAL/PLATELET
Basophils Absolute: 0 10*3/uL (ref 0.0–0.1)
Basophils Relative: 0.2 % (ref 0.0–3.0)
EOS PCT: 1.3 % (ref 0.0–5.0)
Eosinophils Absolute: 0.2 10*3/uL (ref 0.0–0.7)
HEMATOCRIT: 46.2 % — AB (ref 36.0–46.0)
Hemoglobin: 15.2 g/dL — ABNORMAL HIGH (ref 12.0–15.0)
LYMPHS ABS: 1.9 10*3/uL (ref 0.7–4.0)
Lymphocytes Relative: 17.3 % (ref 12.0–46.0)
MCHC: 32.8 g/dL (ref 30.0–36.0)
MCV: 95.6 fl (ref 78.0–100.0)
MONO ABS: 1.1 10*3/uL — AB (ref 0.1–1.0)
MONOS PCT: 9.5 % (ref 3.0–12.0)
NEUTROS PCT: 71.7 % (ref 43.0–77.0)
Neutro Abs: 8 10*3/uL — ABNORMAL HIGH (ref 1.4–7.7)
PLATELETS: 319 10*3/uL (ref 150.0–400.0)
RBC: 4.83 Mil/uL (ref 3.87–5.11)
RDW: 15.4 % — ABNORMAL HIGH (ref 11.5–14.6)
WBC: 11.2 10*3/uL — ABNORMAL HIGH (ref 4.5–10.5)

## 2013-05-27 LAB — HEPATIC FUNCTION PANEL
ALBUMIN: 3.7 g/dL (ref 3.5–5.2)
ALT: 15 U/L (ref 0–35)
AST: 23 U/L (ref 0–37)
Alkaline Phosphatase: 56 U/L (ref 39–117)
BILIRUBIN TOTAL: 0.6 mg/dL (ref 0.3–1.2)
Bilirubin, Direct: 0.1 mg/dL (ref 0.0–0.3)
Total Protein: 6.9 g/dL (ref 6.0–8.3)

## 2013-05-27 LAB — LIPID PANEL
CHOLESTEROL: 200 mg/dL (ref 0–200)
HDL: 68.7 mg/dL (ref >39.00)
LDL CALC: 108 mg/dL — AB (ref 0–99)
TRIGLYCERIDES: 116 mg/dL (ref 0.0–149.0)
Total CHOL/HDL Ratio: 3
VLDL: 23.2 mg/dL (ref 0.0–40.0)

## 2013-05-27 LAB — BASIC METABOLIC PANEL
BUN: 13 mg/dL (ref 6–23)
CO2: 32 mEq/L (ref 19–32)
CREATININE: 0.8 mg/dL (ref 0.4–1.2)
Calcium: 9.2 mg/dL (ref 8.4–10.5)
Chloride: 102 mEq/L (ref 96–112)
GFR: 67.22 mL/min (ref >60.00)
GLUCOSE: 120 mg/dL — AB (ref 70–99)
Potassium: 3.5 mEq/L (ref 3.5–5.1)
Sodium: 139 mEq/L (ref 135–145)

## 2013-05-27 LAB — TSH: TSH: 1.31 u[IU]/mL (ref 0.35–5.50)

## 2013-05-27 LAB — POCT INR: INR: 2.3

## 2013-05-27 MED ORDER — DIGOXIN 125 MCG PO TABS: 0.1250 mg | ORAL_TABLET | Freq: Every day | ORAL | Status: DC

## 2013-05-27 MED ORDER — FOLIC ACID 1 MG PO TABS: 1.0000 mg | ORAL_TABLET | Freq: Every day | ORAL | Status: DC

## 2013-05-27 MED ORDER — LEVOTHYROXINE SODIUM 75 MCG PO TABS: 75.0000 ug | ORAL_TABLET | Freq: Every day | ORAL | Status: DC

## 2013-05-27 MED ORDER — DILTIAZEM HCL ER COATED BEADS 240 MG PO CP24: 240.0000 mg | ORAL_CAPSULE | Freq: Every day | ORAL | Status: DC

## 2013-05-27 NOTE — Progress Notes (Signed)
Subjective:    Patient ID: [name] [name], female    DOB: [birth year], 79 y.o.   MRN: [id number]  HPI  Here to establish new PCP with Dr Debby Bud retirement;  Overall doing ok; quite bright, walks with walker, lives independent living,  Pt denies CP, worsening SOB, DOE, wheezing, orthopnea, PND, worsening LE edema, palpitations, dizziness or syncope.  Pt denies neurological change such as new headache, facial or extremity weakness.  Pt denies polydipsia, polyuria, or low sugar symptoms. Pt states overall good compliance with treatment and medications, good tolerability, and has been trying to follow lower cholesterol diet. , though maintaining wt has been som difficult recently.  Pt denies worsening depressive symptoms, suicidal ideation or panic. No fever, night sweats, wt loss, loss of appetite, or other constitutional symptoms.  Pt states good ability with ADL's, has mod fall risk, home safety reviewed and adequate, no other significant changes in hearing or vision, and only occasionally active with exercise. Denies hyper or hypo thyroid symptoms such as voice, skin or hair change. Past Medical History  Diagnosis Date  . Unspecified cerebral artery occlusion with cerebral infarction   . Recurrent cold sores   . Xanthelasma of eyelid(374.51)   . Ganglion cyst   . Anatomical narrow angle borderline glaucoma   . Hypothyroidism   . Hypertension   . Atrial fibrillation   . Sjogren's syndrome   . Goiter   . Rheumatoid arthritis(714.0)   . Osteoporosis    Past Surgical History  Procedure Laterality Date  . Appendectomy    . Vaginal cyst removed  1998     reports that she has never smoked. She does not have any smokeless tobacco history on file. She reports that she does not drink alcohol or use illicit drugs. family history is not on file. Allergies  Allergen Reactions  . Hydroxychloroquine Sulfate     REACTION: rash   Current Outpatient Prescriptions on File Prior to Visit  Medication  Sig Dispense Refill  . hydrochlorothiazide (HYDRODIURIL) 25 MG tablet Take 0.5 tablets (12.5 mg total) by mouth daily.  15 tablet  11  . methotrexate 2.5 MG tablet Take 15 mg by mouth once a week. Takes on Wednesday      . predniSONE (DELTASONE) 5 MG tablet Take 5 mg by mouth daily.        Marland Kitchen warfarin (COUMADIN) 5 MG tablet Take as directed by anticoagulation clinic  30 tablet  3   No current facility-administered medications on file prior to visit.   Review of Systems  Constitutional: Negative for unexpected weight change, or unusual diaphoresis  HENT: Negative for tinnitus.   Eyes: Negative for photophobia and visual disturbance.  Respiratory: Negative for choking and stridor.   Gastrointestinal: Negative for vomiting and blood in stool.  Genitourinary: Negative for hematuria and decreased urine volume.  Musculoskeletal: Negative for acute joint swelling Skin: Negative for color change and wound.  Neurological: Negative for tremors and numbness other than noted  Psychiatric/Behavioral: Negative for decreased concentration or  hyperactivity.  '     Objective:   Physical Exam BP 140/70  Pulse 81  Temp(Src) 96.4 F (35.8 C) (Oral)  Wt 106 lb 2 oz (48.138 kg)  SpO2 96% VS noted,  Constitutional: Pt appears well-developed and well-nourished.  HENT: Head: NCAT.  Right Ear: External ear normal.  Left Ear: External ear normal.  Eyes: Conjunctivae and EOM are normal. Pupils are equal, round, and reactive to light.  Neck: Normal range of motion. Neck  supple.  Cardiovascular: Normal rate and irregular rhythm.   Pulmonary/Chest: Effort normal and breath sounds normal.  - no rales or wheezing Abd:  Soft, NT, non-distended, + BS Neurological: Pt is alert. Not confused  Skin: Skin is warm. No erythema.  Psychiatric: Pt behavior is normal. Thought content normal.     Assessment & Plan:

## 2013-05-27 NOTE — Progress Notes (Signed)
Pre visit review using our clinic review tool, if applicable. No additional management support is needed unless otherwise documented below in the visit note. 

## 2013-05-27 NOTE — Assessment & Plan Note (Signed)
Asympt, stable overall by history and exam, recent data reviewed with pt, and pt to continue medical treatment as before,  to f/u any worsening symptoms or concerns For f/u lab Lab Results  Component Value Date   TSH 2.11 01/27/2010

## 2013-05-27 NOTE — Assessment & Plan Note (Signed)
stable overall by history and exam, recent data reviewed with pt, and pt to continue medical treatment as before,  to f/u any worsening symptoms or concerns BP Readings from Last 3 Encounters:  05/27/13 140/70  01/02/13 128/62  12/01/12 140/62

## 2013-05-27 NOTE — Patient Instructions (Addendum)
You had the new Prevnar pneumonia shot today  Please continue all other medications as before, and refills have been done if requested. Please have the pharmacy call with any other refills you may need.  Please continue your efforts at being more active, low cholesterol diet.  You may want to try Ensure up to 1-2 cans per day to help with further weight loss  You are otherwise up to date with prevention measures today.  Please keep your appointments with your specialists as you have planned - Dr Dierdre Forth for the arthritis, and the coumadin clinic  Please go to the LAB in the Basement (turn left off the elevator) for the tests to be done today You will be contacted by phone if any changes need to be made immediately.  Otherwise, you will receive a letter about your results with an explanation, but please check with MyChart first.  Please return in 6 months, or sooner if needed

## 2013-05-27 NOTE — Telephone Encounter (Signed)
Relevant patient education assigned to patient using Emmi. ° °

## 2013-05-27 NOTE — Assessment & Plan Note (Signed)
stable overall by history and exam, recent data reviewed with pt, and pt to continue medical treatment as before,  to f/u any worsening symptoms or concerns, ok rate and volume, to cont coumadin

## 2013-06-09 ENCOUNTER — Other Ambulatory Visit: Payer: Self-pay

## 2013-06-09 MED ORDER — HYDROCHLOROTHIAZIDE 25 MG PO TABS: 12.5000 mg | ORAL_TABLET | Freq: Every day | ORAL | Status: DC

## 2013-06-24 ENCOUNTER — Ambulatory Visit (INDEPENDENT_AMBULATORY_CARE_PROVIDER_SITE_OTHER): Payer: Medicare Other | Admitting: General Practice

## 2013-06-24 DIAGNOSIS — Z5181 Encounter for therapeutic drug level monitoring: Secondary | ICD-10-CM

## 2013-06-24 DIAGNOSIS — I4891 Unspecified atrial fibrillation: Secondary | ICD-10-CM

## 2013-06-24 DIAGNOSIS — I6350 Cerebral infarction due to unspecified occlusion or stenosis of unspecified cerebral artery: Secondary | ICD-10-CM

## 2013-06-24 DIAGNOSIS — Z7901 Long term (current) use of anticoagulants: Secondary | ICD-10-CM

## 2013-06-24 LAB — POCT INR: INR: 3.9

## 2013-06-24 NOTE — Progress Notes (Signed)
Pre visit review using our clinic review tool, if applicable. No additional management support is needed unless otherwise documented below in the visit note. 

## 2013-07-10 ENCOUNTER — Ambulatory Visit (INDEPENDENT_AMBULATORY_CARE_PROVIDER_SITE_OTHER): Payer: Medicare Other | Admitting: General Practice

## 2013-07-10 DIAGNOSIS — I4891 Unspecified atrial fibrillation: Secondary | ICD-10-CM

## 2013-07-10 DIAGNOSIS — Z7901 Long term (current) use of anticoagulants: Secondary | ICD-10-CM

## 2013-07-10 DIAGNOSIS — I6350 Cerebral infarction due to unspecified occlusion or stenosis of unspecified cerebral artery: Secondary | ICD-10-CM

## 2013-07-10 DIAGNOSIS — Z5181 Encounter for therapeutic drug level monitoring: Secondary | ICD-10-CM

## 2013-07-10 LAB — POCT INR: INR: 1.9

## 2013-07-10 NOTE — Progress Notes (Signed)
Pre visit review using our clinic review tool, if applicable. No additional management support is needed unless otherwise documented below in the visit note. 

## 2013-08-07 ENCOUNTER — Ambulatory Visit (INDEPENDENT_AMBULATORY_CARE_PROVIDER_SITE_OTHER): Payer: Medicare Other | Admitting: Family Medicine

## 2013-08-07 DIAGNOSIS — Z7901 Long term (current) use of anticoagulants: Secondary | ICD-10-CM

## 2013-08-07 DIAGNOSIS — Z5181 Encounter for therapeutic drug level monitoring: Secondary | ICD-10-CM

## 2013-08-07 DIAGNOSIS — I6350 Cerebral infarction due to unspecified occlusion or stenosis of unspecified cerebral artery: Secondary | ICD-10-CM

## 2013-08-07 DIAGNOSIS — I4891 Unspecified atrial fibrillation: Secondary | ICD-10-CM

## 2013-08-07 LAB — POCT INR: INR: 1.8

## 2013-08-27 ENCOUNTER — Other Ambulatory Visit: Payer: Self-pay

## 2013-08-27 MED ORDER — LEVOTHYROXINE SODIUM 75 MCG PO TABS: 75.0000 ug | ORAL_TABLET | Freq: Every day | ORAL | Status: DC

## 2013-09-04 ENCOUNTER — Ambulatory Visit (INDEPENDENT_AMBULATORY_CARE_PROVIDER_SITE_OTHER): Payer: Medicare Other | Admitting: General Practice

## 2013-09-04 DIAGNOSIS — Z5181 Encounter for therapeutic drug level monitoring: Secondary | ICD-10-CM

## 2013-09-04 DIAGNOSIS — I6350 Cerebral infarction due to unspecified occlusion or stenosis of unspecified cerebral artery: Secondary | ICD-10-CM

## 2013-09-04 DIAGNOSIS — I4891 Unspecified atrial fibrillation: Secondary | ICD-10-CM

## 2013-09-04 DIAGNOSIS — Z7901 Long term (current) use of anticoagulants: Secondary | ICD-10-CM

## 2013-09-04 LAB — POCT INR: INR: 2.5

## 2013-09-04 NOTE — Progress Notes (Signed)
Pre visit review using our clinic review tool, if applicable. No additional management support is needed unless otherwise documented below in the visit note. 

## 2013-09-10 ENCOUNTER — Other Ambulatory Visit: Payer: Self-pay | Admitting: General Practice

## 2013-09-10 ENCOUNTER — Other Ambulatory Visit: Payer: Self-pay

## 2013-09-10 MED ORDER — WARFARIN SODIUM 5 MG PO TABS
ORAL_TABLET | ORAL | Status: DC
Start: 2013-09-10 — End: 2014-08-30

## 2013-09-24 ENCOUNTER — Other Ambulatory Visit: Payer: Self-pay | Admitting: *Deleted

## 2013-09-24 MED ORDER — HYDROCHLOROTHIAZIDE 25 MG PO TABS: 12.5000 mg | ORAL_TABLET | Freq: Every day | ORAL | Status: DC

## 2013-10-02 ENCOUNTER — Ambulatory Visit (INDEPENDENT_AMBULATORY_CARE_PROVIDER_SITE_OTHER): Payer: Medicare Other | Admitting: General Practice

## 2013-10-02 DIAGNOSIS — Z7901 Long term (current) use of anticoagulants: Secondary | ICD-10-CM

## 2013-10-02 DIAGNOSIS — Z5181 Encounter for therapeutic drug level monitoring: Secondary | ICD-10-CM

## 2013-10-02 DIAGNOSIS — I6350 Cerebral infarction due to unspecified occlusion or stenosis of unspecified cerebral artery: Secondary | ICD-10-CM

## 2013-10-02 DIAGNOSIS — I4891 Unspecified atrial fibrillation: Secondary | ICD-10-CM

## 2013-10-02 LAB — POCT INR: INR: 2.2

## 2013-10-02 NOTE — Progress Notes (Signed)
Pre visit review using our clinic review tool, if applicable. No additional management support is needed unless otherwise documented below in the visit note. 

## 2013-10-27 ENCOUNTER — Ambulatory Visit: Payer: Medicare Other | Admitting: Cardiology

## 2013-10-28 ENCOUNTER — Ambulatory Visit (INDEPENDENT_AMBULATORY_CARE_PROVIDER_SITE_OTHER): Payer: Medicare Other | Admitting: *Deleted

## 2013-10-28 DIAGNOSIS — Z7901 Long term (current) use of anticoagulants: Secondary | ICD-10-CM

## 2013-10-28 DIAGNOSIS — I4891 Unspecified atrial fibrillation: Secondary | ICD-10-CM

## 2013-10-28 DIAGNOSIS — Z5181 Encounter for therapeutic drug level monitoring: Secondary | ICD-10-CM

## 2013-10-28 DIAGNOSIS — I6350 Cerebral infarction due to unspecified occlusion or stenosis of unspecified cerebral artery: Secondary | ICD-10-CM

## 2013-10-28 LAB — POCT INR: INR: 1.4

## 2013-11-18 ENCOUNTER — Other Ambulatory Visit: Payer: Self-pay

## 2013-11-18 MED ORDER — LEVOTHYROXINE SODIUM 75 MCG PO TABS: 75.0000 ug | ORAL_TABLET | Freq: Every day | ORAL | Status: AC

## 2013-11-20 ENCOUNTER — Other Ambulatory Visit: Payer: Self-pay

## 2013-11-20 MED ORDER — HYDROCHLOROTHIAZIDE 25 MG PO TABS: 12.5000 mg | ORAL_TABLET | Freq: Every day | ORAL | Status: DC

## 2013-11-25 ENCOUNTER — Ambulatory Visit (INDEPENDENT_AMBULATORY_CARE_PROVIDER_SITE_OTHER): Payer: Medicare Other | Admitting: *Deleted

## 2013-11-25 ENCOUNTER — Encounter: Payer: Self-pay | Admitting: Internal Medicine

## 2013-11-25 ENCOUNTER — Ambulatory Visit (INDEPENDENT_AMBULATORY_CARE_PROVIDER_SITE_OTHER): Payer: Medicare Other | Admitting: Internal Medicine

## 2013-11-25 VITALS — BP 138/62 | HR 67 | Temp 97.9°F | Wt 108.1 lb

## 2013-11-25 DIAGNOSIS — I6350 Cerebral infarction due to unspecified occlusion or stenosis of unspecified cerebral artery: Secondary | ICD-10-CM

## 2013-11-25 DIAGNOSIS — R7309 Other abnormal glucose: Secondary | ICD-10-CM

## 2013-11-25 DIAGNOSIS — Z23 Encounter for immunization: Secondary | ICD-10-CM

## 2013-11-25 DIAGNOSIS — Z0000 Encounter for general adult medical examination without abnormal findings: Secondary | ICD-10-CM

## 2013-11-25 DIAGNOSIS — R7302 Impaired glucose tolerance (oral): Secondary | ICD-10-CM

## 2013-11-25 DIAGNOSIS — I4891 Unspecified atrial fibrillation: Secondary | ICD-10-CM

## 2013-11-25 DIAGNOSIS — Z7901 Long term (current) use of anticoagulants: Secondary | ICD-10-CM

## 2013-11-25 DIAGNOSIS — Z5181 Encounter for therapeutic drug level monitoring: Secondary | ICD-10-CM

## 2013-11-25 DIAGNOSIS — I10 Essential (primary) hypertension: Secondary | ICD-10-CM

## 2013-11-25 DIAGNOSIS — E039 Hypothyroidism, unspecified: Secondary | ICD-10-CM

## 2013-11-25 HISTORY — DX: Impaired glucose tolerance (oral): R73.02

## 2013-11-25 LAB — POCT INR: INR: 2.1

## 2013-11-25 MED ORDER — HYDROCHLOROTHIAZIDE 25 MG PO TABS: 12.5000 mg | ORAL_TABLET | Freq: Every day | ORAL | Status: DC

## 2013-11-25 NOTE — Addendum Note (Signed)
Addended by: Scharlene Gloss B on: 11/25/2013 03:23 PM   Modules accepted: Orders

## 2013-11-25 NOTE — Patient Instructions (Addendum)
You had the flu shot today  Please continue all other medications as before, and refills have been done if requested.  Please have the pharmacy call with any other refills you may need.  Please continue your efforts at being more active, low cholesterol diet, and weight control.  You are otherwise up to date with prevention measures today.  Please keep your appointments with your specialists as you may have planned - Dr Antoine Poche next week as planned  Please return in 6 months, or sooner if needed, with Lab testing done 3-5 days before, if possible

## 2013-11-25 NOTE — Progress Notes (Signed)
Subjective:    Patient ID: [name] [name], female    DOB: 06/28/1918, 80 y.o.   MRN: [id number]  HPI  Here for wellness and f/u;  Overall doing ok;  Pt denies CP, worsening SOB, DOE, wheezing, orthopnea, PND, worsening LE edema, palpitations, dizziness or syncope.  Pt denies neurological change such as new headache, facial or extremity weakness.  Pt denies polydipsia, polyuria, or low sugar symptoms. Pt states overall good compliance with treatment and medications, good tolerability, and has been trying to follow lower cholesterol diet.  Pt denies worsening depressive symptoms, suicidal ideation or panic. No fever, night sweats, wt loss, loss of appetite, or other constitutional symptoms.  Pt states good ability with ADL's, has low fall risk, home safety reviewed and adequate, no other significant changes in hearing or vision, and only occasionally active with exercise.  No recent falls. Walks with walker.  Denies hyper or hypo thyroid symptoms such as voice, skin or hair change.  Pt denies fever, wt loss, night sweats, loss of appetite, or other constitutional symptoms  Out of HCT - needs refill to get back to Dr Hochrein/card next wk. Past Medical History  Diagnosis Date  . Unspecified cerebral artery occlusion with cerebral infarction   . Recurrent cold sores   . Xanthelasma of eyelid(374.51)   . Ganglion cyst   . Anatomical narrow angle borderline glaucoma   . Hypothyroidism   . Hypertension   . Atrial fibrillation   . Sjogren's syndrome   . Goiter   . Rheumatoid arthritis(714.0)   . Osteoporosis   . Impaired glucose tolerance 11/25/2013   Past Surgical History  Procedure Laterality Date  . Appendectomy    . Vaginal cyst removed  1998     reports that she has never smoked. She does not have any smokeless tobacco history on file. She reports that she does not drink alcohol or use illicit drugs. family history is not on file. Allergies  Allergen Reactions  . Hydroxychloroquine  Sulfate     REACTION: rash   Current Outpatient Prescriptions on File Prior to Visit  Medication Sig Dispense Refill  . digoxin (DIGOX) 0.125 MG tablet Take 1 tablet (0.125 mg total) by mouth daily.  90 tablet  3  . diltiazem (CARDIZEM CD) 240 MG 24 hr capsule Take 1 capsule (240 mg total) by mouth daily.  90 capsule  3  . folic acid (FOLVITE) 1 MG tablet Take 1 tablet (1 mg total) by mouth daily.  90 tablet  3  . hydrochlorothiazide (HYDRODIURIL) 25 MG tablet Take 0.5 tablets (12.5 mg total) by mouth daily.  15 tablet  0  . levothyroxine (SYNTHROID, LEVOTHROID) 75 MCG tablet Take 1 tablet (75 mcg total) by mouth daily.  90 tablet  3  . methotrexate 2.5 MG tablet Take 15 mg by mouth once a week. Takes on Wednesday      . predniSONE (DELTASONE) 5 MG tablet Take 5 mg by mouth daily.        Marland Kitchen warfarin (COUMADIN) 5 MG tablet Take as directed by anticoagulation clinic  30 tablet  3   No current facility-administered medications on file prior to visit.   Review of Systems Constitutional: Negative for increased diaphoresis, other activity, appetite or other siginficant weight change  HENT: Negative for worsening hearing loss, ear pain, facial swelling, mouth sores and neck stiffness.   Eyes: Negative for other worsening pain, redness or visual disturbance.  Respiratory: Negative for shortness of breath and wheezing.  Cardiovascular: Negative for chest pain and palpitations.  Gastrointestinal: Negative for diarrhea, blood in stool, abdominal distention or other pain Genitourinary: Negative for hematuria, flank pain or change in urine volume.  Musculoskeletal: Negative for myalgias or other joint complaints.  Skin: Negative for color change and wound.  Neurological: Negative for syncope and numbness. other than noted Hematological: Negative for adenopathy. or other swelling Psychiatric/Behavioral: Negative for hallucinations, self-injury, decreased concentration or other worsening agitation.       Objective:   Physical Exam BP 138/62  Pulse 67  Temp(Src) 97.9 F (36.6 C) (Oral)  Wt 108 lb 2 oz (49.045 kg)  SpO2 98% VS noted,  Constitutional: Pt is oriented to person, place, and time. Appears well-developed and well-nourished.  Head: Normocephalic and atraumatic.  Right Ear: External ear normal.  Left Ear: External ear normal.  Nose: Nose normal.  Mouth/Throat: Oropharynx is clear and moist.  Eyes: Conjunctivae and EOM are normal. Pupils are equal, round, and reactive to light.  Neck: Normal range of motion. Neck supple. No JVD present. No tracheal deviation present.  Cardiovascular: Normal rate, regular rhythm, normal heart sounds and intact distal pulses.   Pulmonary/Chest: Effort normal and breath sounds without rales or wheezing  Abdominal: Soft. Bowel sounds are normal. NT. No HSM  Musculoskeletal: Normal range of motion. Exhibits no edema.  Lymphadenopathy:  Has no cervical adenopathy.  Neurological: Pt is alert and oriented to person, place, and time. Pt has normal reflexes. No cranial nerve deficit. Motor grossly intact Skin: Skin is warm and dry. No rash noted.  Psychiatric:  Has normal mood and affect. Behavior is normal.     Assessment & Plan:

## 2013-11-25 NOTE — Progress Notes (Signed)
Pre visit review using our clinic review tool, if applicable. No additional management support is needed unless otherwise documented below in the visit note. 

## 2013-11-25 NOTE — Assessment & Plan Note (Signed)
stable overall by history and exam, recent data reviewed with pt, and pt to continue medical treatment as before,  to f/u any worsening symptoms or concerns Lab Results  Component Value Date   TSH 1.31 05/27/2013

## 2013-11-25 NOTE — Assessment & Plan Note (Signed)
Mild, asympt, for a1c next visit, declines labs today

## 2013-11-25 NOTE — Assessment & Plan Note (Signed)
stable overall by history and exam, recent data reviewed with pt, and pt to continue medical treatment as before,  to f/u any worsening symptoms or concerns Lab Results  Component Value Date   TSH 1.31 05/27/2013    

## 2013-12-01 ENCOUNTER — Other Ambulatory Visit: Payer: Self-pay | Admitting: *Deleted

## 2013-12-01 ENCOUNTER — Encounter: Payer: Self-pay | Admitting: Cardiology

## 2013-12-01 ENCOUNTER — Ambulatory Visit (INDEPENDENT_AMBULATORY_CARE_PROVIDER_SITE_OTHER): Payer: Medicare Other | Admitting: Cardiology

## 2013-12-01 VITALS — BP 140/62 | HR 76 | Ht 62.0 in | Wt 108.0 lb

## 2013-12-01 DIAGNOSIS — I4891 Unspecified atrial fibrillation: Secondary | ICD-10-CM

## 2013-12-01 DIAGNOSIS — I482 Chronic atrial fibrillation, unspecified: Secondary | ICD-10-CM

## 2013-12-01 MED ORDER — DILTIAZEM HCL ER COATED BEADS 240 MG PO CP24: 240.0000 mg | ORAL_CAPSULE | Freq: Every day | ORAL | Status: DC

## 2013-12-01 MED ORDER — DIGOXIN 125 MCG PO TABS: 0.1250 mg | ORAL_TABLET | Freq: Every day | ORAL | Status: DC

## 2013-12-01 NOTE — Patient Instructions (Signed)
Your physician recommends that you schedule a follow-up appointment in: one year with Dr. Hochrein  

## 2013-12-01 NOTE — Progress Notes (Signed)
Patient ID: [name] [name], female   DOB: 12-31-1918, 80 y.o.   MRN: [id number]   HPI The patient returns for followup of her atrial fibrillation.  Since 2013 pt has been doing well from a cardiovascular standpoint. Has noticed worsening from an arthritis standpoint.  She denies any new palpitations, presyncope or syncope. Does occasionally feel dizzy, is about the same from baseline. Known stroke in 2011. She denies any chest pressure, neck or arm discomfort.  She has no new shortness of breath, PND or orthopnea. No bruising or bleeding no falls and no bloody stools.   INR 2.2 (11/2013). Has had some GI sx in the past including stomach discomfort and loose stools. But fairly complete w/up per PCP Dr. Jonny Ruiz. TSH 1.31 3.18.2015  Allergies  Allergen Reactions  . Hydroxychloroquine Sulfate     REACTION: rash    Current Outpatient Prescriptions  Medication Sig Dispense Refill  . digoxin (DIGOX) 0.125 MG tablet Take 1 tablet (0.125 mg total) by mouth daily.  90 tablet  3  . diltiazem (CARDIZEM CD) 240 MG 24 hr capsule Take 1 capsule (240 mg total) by mouth daily.  90 capsule  3  . folic acid (FOLVITE) 1 MG tablet Take 1 tablet (1 mg total) by mouth daily.  90 tablet  3  . hydrochlorothiazide (HYDRODIURIL) 25 MG tablet Take 0.5 tablets (12.5 mg total) by mouth daily.  30 tablet  11  . levothyroxine (SYNTHROID, LEVOTHROID) 75 MCG tablet Take 1 tablet (75 mcg total) by mouth daily.  90 tablet  3  . methotrexate 2.5 MG tablet Take 15 mg by mouth once a week. Takes on Wednesday      . predniSONE (DELTASONE) 5 MG tablet Take 5 mg by mouth daily.        Marland Kitchen warfarin (COUMADIN) 5 MG tablet Take as directed by anticoagulation clinic  30 tablet  3   No current facility-administered medications for this visit.     PHYSICAL EXAM BP 140/62  Pulse 76  Ht 5\' 2"  (1.575 m)  Wt 108 lb (48.988 kg)  BMI 19.75 kg/m2 Gen: NAD, alert, cooperative with exam, thin HEENT: NCAT, EOMI Neck: FROM, supple, no  bruit CV: irreg irreg, good S1/S2, no murmur, cap refill <3, JVD 5 cm Resp: CTABL, no wheezes, non-labored Abd: SNTND, BS present, no guarding or organomegaly Ext: trace edema, venous stasis, warm, normal tone, moves UE/LE spontaneously, ulnar deviation Neuro: Alert and oriented, No gross deficits Skin: skin changes assc with venous stasis  EKG: Afib with rate 70s. Left axis. PVCs. No ST changes  12/01/2013  Dig level 11/17/2011 0.5 Cr 0.8  ASSESSMENT AND PLAN  AFIB- rate controlled; asymptomatic; no falls no bleeds  -currently on dilt, warfarin and dig- last level 0.5 -therapeutic on warfarin- benefits outweigh bleeding risk for now  HTN- at goal per age; taking HCTZ 12.5 -trend BMETs for electrolyte abnormalities given hx of dizziness  RA- currently managed by Rheum; on prednisone and MTX with some improvement -hopeful that this will allow some weight gain  Hypothyroid- not endorsing weight loss/gain acutely, no hair changes or changes in energy level - currently on synthroid; last TSH wnl  History and all data above reviewed.  Patient examined.  I agree with the findings as above.   She has done well since I last saw her.  The patient denies any new symptoms such as chest discomfort, neck or arm discomfort. There has been no new shortness of breath, PND or orthopnea. There have  been no reported palpitations, presyncope or syncope. The patient exam reveals [id number]  ,  Lungs: Clear  ,  Abd: Positive bowel sounds, no rebound no guarding, Ext No edema  .  All available labs, radiology testing, previous records reviewed. Agree with documented assessment and plan. Atrial fib:  She is doing well.  No change in therapy.    Fayrene Fearing Hochrein  3:19 PM  12/01/2013

## 2013-12-22 ENCOUNTER — Telehealth: Payer: Self-pay | Admitting: *Deleted

## 2013-12-22 NOTE — Telephone Encounter (Signed)
Yes, this is best due to the Rheum Arthritis, that is not normally tx well except by rheumatology

## 2013-12-22 NOTE — Telephone Encounter (Signed)
Left msg on triage stating Dr. Debby Bud referred her to see a rheumatologist now she is seeing Dr. Jonny Ruiz wanting to know does she need to keep appt...[name] [name]

## 2013-12-23 NOTE — Telephone Encounter (Signed)
Called pt no answer couldn't leave msg due to vm not being set-up...[name] [name]

## 2013-12-25 NOTE — Telephone Encounter (Signed)
Called pt back spoke with daughter [name] [name]) gave her md response...[name] [name]

## 2014-01-18 ENCOUNTER — Ambulatory Visit: Payer: Medicare Other | Admitting: Family

## 2014-01-25 ENCOUNTER — Telehealth: Payer: Self-pay | Admitting: Internal Medicine

## 2014-01-25 DIAGNOSIS — Z7901 Long term (current) use of anticoagulants: Secondary | ICD-10-CM

## 2014-01-25 NOTE — Telephone Encounter (Signed)
Entered PT/INR notified daughter order has been place...Raechel Chute

## 2014-01-25 NOTE — Telephone Encounter (Signed)
Can you please enter labs for coum so patient can go down to lab.  Coum clinic is full this week and patient has already missed one visit.

## 2014-01-26 ENCOUNTER — Other Ambulatory Visit (INDEPENDENT_AMBULATORY_CARE_PROVIDER_SITE_OTHER): Payer: Medicare Other

## 2014-01-26 DIAGNOSIS — Z7901 Long term (current) use of anticoagulants: Secondary | ICD-10-CM

## 2014-01-26 LAB — PROTIME-INR
INR: 2.7 ratio — ABNORMAL HIGH (ref 0.8–1.0)
Prothrombin Time: 29.4 s — ABNORMAL HIGH (ref 9.6–13.1)

## 2014-01-29 ENCOUNTER — Telehealth: Payer: Self-pay

## 2014-01-29 NOTE — Telephone Encounter (Signed)
Pt daughter called and wanted to know that the current anticoagulation dosage should be since pt last INR was done (01/26/14).   I asked for the assistance of Dr. Dorise Hiss and she indicated that the current regimen was adequate and appropriate for the pt. 2.5 mg everyday except for Wednesday with a dose of 5mg  on that day only.

## 2014-02-23 ENCOUNTER — Ambulatory Visit (INDEPENDENT_AMBULATORY_CARE_PROVIDER_SITE_OTHER): Payer: Medicare Other

## 2014-02-23 DIAGNOSIS — Z5181 Encounter for therapeutic drug level monitoring: Secondary | ICD-10-CM

## 2014-02-23 LAB — POCT INR: INR: 2

## 2014-04-07 ENCOUNTER — Ambulatory Visit: Payer: Self-pay

## 2014-05-25 ENCOUNTER — Other Ambulatory Visit: Payer: Self-pay | Admitting: Internal Medicine

## 2014-05-25 DIAGNOSIS — Z7901 Long term (current) use of anticoagulants: Secondary | ICD-10-CM

## 2014-05-27 ENCOUNTER — Ambulatory Visit (INDEPENDENT_AMBULATORY_CARE_PROVIDER_SITE_OTHER): Payer: Medicare Other | Admitting: Internal Medicine

## 2014-05-27 ENCOUNTER — Encounter: Payer: Self-pay | Admitting: Internal Medicine

## 2014-05-27 VITALS — BP 126/68 | HR 79 | Temp 97.5°F | Resp 16 | Ht 60.0 in | Wt 102.0 lb

## 2014-05-27 DIAGNOSIS — I482 Chronic atrial fibrillation, unspecified: Secondary | ICD-10-CM

## 2014-05-27 DIAGNOSIS — R7302 Impaired glucose tolerance (oral): Secondary | ICD-10-CM

## 2014-05-27 DIAGNOSIS — R634 Abnormal weight loss: Secondary | ICD-10-CM

## 2014-05-27 DIAGNOSIS — I10 Essential (primary) hypertension: Secondary | ICD-10-CM

## 2014-05-27 DIAGNOSIS — Z0000 Encounter for general adult medical examination without abnormal findings: Secondary | ICD-10-CM | POA: Insufficient documentation

## 2014-05-27 MED ORDER — MIRTAZAPINE 15 MG PO TABS: 15.0000 mg | ORAL_TABLET | Freq: Every day | ORAL | Status: DC

## 2014-05-27 NOTE — Assessment & Plan Note (Signed)
Overall doing well, age appropriate education and counseling updated, referrals for preventative services and immunizations addressed, dietary and smoking counseling addressed, most recent labs reviewed.  I have personally reviewed and have noted:  1) the patient's medical and social history 2) The pt's use of alcohol, tobacco, and illicit drugs 3) The patient's current medications and supplements 4) Functional ability including ADL's, fall risk, home safety risk, hearing and visual impairment 5) Diet and physical activities 6) Evidence for depression or mood disorder 7) The patient's height, weight, and BMI have been recorded in the chart  I have made referrals, and provided counseling and education based on review of the above OK to hold off on labs today per pt preference

## 2014-05-27 NOTE — Patient Instructions (Signed)
Please take all new medication as prescribed - the remeron  Please continue all other medications as before, and refills have been done if requested.  Please have the pharmacy call with any other refills you may need.  Please continue your efforts at being more active, low cholesterol diet, and weight control.  You are otherwise up to date with prevention measures today.  Please keep your appointments with your specialists as you may have planned  No further lab work needed today  Please return in 6 months, or sooner if needed

## 2014-05-27 NOTE — Assessment & Plan Note (Signed)
For remeron qhs 15 mg,  to f/u any worsening symptoms or concerns

## 2014-05-27 NOTE — Progress Notes (Signed)
Subjective:    Patient ID: [name] [name], female    DOB: 09-06-1918, 81 y.o.   MRN: [id number]  HPI  Here for wellness and f/u;  Overall doing ok;  Pt denies Chest pain, worsening SOB, DOE, wheezing, orthopnea, PND, worsening LE edema, palpitations, dizziness or syncope.  Pt denies neurological change such as new headache, facial or extremity weakness.  Pt denies polydipsia, polyuria, or low sugar symptoms. Pt states overall good compliance with treatment and medications, good tolerability, and has been trying to follow appropriate diet.  Pt denies worsening depressive symptoms, suicidal ideation or panic. No fever, night sweats, wt loss, loss of appetite, or other constitutional symptoms.  Pt states good ability with ADL's, has low fall risk, home safety reviewed and adequate, no other significant changes in hearing or vision, and only occasionally active with exercise. Wt Readings from Last 3 Encounters:  05/27/14 102 lb (46.267 kg)  12/01/13 108 lb (48.988 kg)  11/25/13 108 lb 2 oz (49.045 kg)  Has some sleep difficulty, several lbs wt loss, Denies worsening depressive symptoms, suicidal ideation, or panic; ok with start remeron 15 qhs.  Had osteoporosis screening with rheum, on daily prednisone.  Lives alone, walks with walker, no recent falls.  Hasw labs q 3 mo with rheum Past Medical History  Diagnosis Date  . Unspecified cerebral artery occlusion with cerebral infarction   . Recurrent cold sores   . Xanthelasma of eyelid(374.51)   . Ganglion cyst   . Anatomical narrow angle borderline glaucoma   . Hypothyroidism   . Hypertension   . Atrial fibrillation   . Sjogren's syndrome   . Goiter   . Rheumatoid arthritis(714.0)   . Osteoporosis   . Impaired glucose tolerance 11/25/2013   Past Surgical History  Procedure Laterality Date  . Appendectomy    . Vaginal cyst removed  1998     reports that she has never smoked. She does not have any smokeless tobacco history on file. She  reports that she does not drink alcohol or use illicit drugs. family history is not on file. Allergies  Allergen Reactions  . Hydroxychloroquine Sulfate     REACTION: rash   Current Outpatient Prescriptions on File Prior to Visit  Medication Sig Dispense Refill  . digoxin (DIGOX) 0.125 MG tablet Take 1 tablet (0.125 mg total) by mouth daily. 90 tablet 3  . diltiazem (CARDIZEM CD) 240 MG 24 hr capsule Take 1 capsule (240 mg total) by mouth daily. 90 capsule 3  . folic acid (FOLVITE) 1 MG tablet Take 1 tablet (1 mg total) by mouth daily. 90 tablet 3  . hydrochlorothiazide (HYDRODIURIL) 25 MG tablet Take 0.5 tablets (12.5 mg total) by mouth daily. 30 tablet 11  . levothyroxine (SYNTHROID, LEVOTHROID) 75 MCG tablet Take 1 tablet (75 mcg total) by mouth daily. 90 tablet 3  . methotrexate 2.5 MG tablet Take 15 mg by mouth once a week. Takes on Wednesday    . predniSONE (DELTASONE) 5 MG tablet Take 5 mg by mouth daily.      Marland Kitchen warfarin (COUMADIN) 5 MG tablet Take as directed by anticoagulation clinic 30 tablet 3   No current facility-administered medications on file prior to visit.     Review of Systems Constitutional: Negative for increased diaphoresis, other activity, appetite or siginficant weight change other than noted HENT: Negative for worsening hearing loss, ear pain, facial swelling, mouth sores and neck stiffness.   Eyes: Negative for other worsening pain, redness or visual disturbance.  Respiratory: Negative for shortness of breath and wheezing  Cardiovascular: Negative for chest pain and palpitations.  Gastrointestinal: Negative for diarrhea, blood in stool, abdominal distention or other pain Genitourinary: Negative for hematuria, flank pain or change in urine volume.  Musculoskeletal: Negative for myalgias or other joint complaints.  Skin: Negative for color change and wound or drainage.  Neurological: Negative for syncope and numbness. other than noted Hematological: Negative  for adenopathy. or other swelling Psychiatric/Behavioral: Negative for hallucinations, SI, self-injury, decreased concentration or other worsening agitation.      Objective:   Physical Exam BP 126/68 mmHg  Pulse 79  Temp(Src) 97.5 F (36.4 C) (Oral)  Resp 16  Ht 5' (1.524 m)  Wt 102 lb (46.267 kg)  BMI 19.92 kg/m2  SpO2 93% VS noted,  Constitutional: Pt is oriented to person, place, and time. Appears well-developed and well-nourished, in no significant distress Head: Normocephalic and atraumatic.  Right Ear: External ear normal.  Left Ear: External ear normal.  Nose: Nose normal.  Mouth/Throat: Oropharynx is clear and moist.  Eyes: Conjunctivae and EOM are normal. Pupils are equal, round, and reactive to light.  Neck: Normal range of motion. Neck supple. No JVD present. No tracheal deviation present or significant neck LA or mass Cardiovascular: Normal rate, irregular rhythm, normal heart sounds and intact distal pulses.   Pulmonary/Chest: Effort normal and breath sounds without rales or wheezing  Abdominal: Soft. Bowel sounds are normal. NT. No HSM  Musculoskeletal: Normal range of motion. Exhibits no edema.  Lymphadenopathy:  Has no cervical adenopathy.  Neurological: Pt is alert and oriented to person, place, and time. Pt has normal reflexes. No cranial nerve deficit. Motor grossly intact Skin: Skin is warm and dry. No rash noted.  Psychiatric:  Has normal mood and affect. Behavior is normal.         Assessment & Plan:

## 2014-05-28 ENCOUNTER — Inpatient Hospital Stay (HOSPITAL_COMMUNITY)
Admission: EM | Admit: 2014-05-28 | Discharge: 2014-05-30 | DRG: 552 | Disposition: A | Discharge status: Home Health | Payer: Medicare Other | Attending: Internal Medicine | Admitting: Internal Medicine

## 2014-05-28 ENCOUNTER — Emergency Department (HOSPITAL_COMMUNITY): Payer: Medicare Other

## 2014-05-28 ENCOUNTER — Encounter (HOSPITAL_COMMUNITY): Payer: Self-pay | Admitting: *Deleted

## 2014-05-28 DIAGNOSIS — Z66 Do not resuscitate: Secondary | ICD-10-CM | POA: Diagnosis present

## 2014-05-28 DIAGNOSIS — N390 Urinary tract infection, site not specified: Secondary | ICD-10-CM | POA: Diagnosis present

## 2014-05-28 DIAGNOSIS — I5022 Chronic systolic (congestive) heart failure: Secondary | ICD-10-CM | POA: Diagnosis present

## 2014-05-28 DIAGNOSIS — I10 Essential (primary) hypertension: Secondary | ICD-10-CM | POA: Diagnosis present

## 2014-05-28 DIAGNOSIS — S3210XD Unspecified fracture of sacrum, subsequent encounter for fracture with routine healing: Secondary | ICD-10-CM | POA: Diagnosis not present

## 2014-05-28 DIAGNOSIS — R531 Weakness: Secondary | ICD-10-CM | POA: Diagnosis present

## 2014-05-28 DIAGNOSIS — I482 Chronic atrial fibrillation: Secondary | ICD-10-CM | POA: Diagnosis not present

## 2014-05-28 DIAGNOSIS — W19XXXA Unspecified fall, initial encounter: Secondary | ICD-10-CM

## 2014-05-28 DIAGNOSIS — Z8673 Personal history of transient ischemic attack (TIA), and cerebral infarction without residual deficits: Secondary | ICD-10-CM

## 2014-05-28 DIAGNOSIS — Y92002 Bathroom of unspecified non-institutional (private) residence single-family (private) house as the place of occurrence of the external cause: Secondary | ICD-10-CM

## 2014-05-28 DIAGNOSIS — E039 Hypothyroidism, unspecified: Secondary | ICD-10-CM | POA: Diagnosis present

## 2014-05-28 DIAGNOSIS — S0003XA Contusion of scalp, initial encounter: Secondary | ICD-10-CM | POA: Diagnosis present

## 2014-05-28 DIAGNOSIS — S3210XA Unspecified fracture of sacrum, initial encounter for closed fracture: Principal | ICD-10-CM | POA: Diagnosis present

## 2014-05-28 DIAGNOSIS — Z7901 Long term (current) use of anticoagulants: Secondary | ICD-10-CM | POA: Diagnosis not present

## 2014-05-28 DIAGNOSIS — I4891 Unspecified atrial fibrillation: Secondary | ICD-10-CM | POA: Diagnosis present

## 2014-05-28 DIAGNOSIS — W1830XA Fall on same level, unspecified, initial encounter: Secondary | ICD-10-CM | POA: Diagnosis present

## 2014-05-28 DIAGNOSIS — R55 Syncope and collapse: Secondary | ICD-10-CM | POA: Diagnosis present

## 2014-05-28 DIAGNOSIS — M069 Rheumatoid arthritis, unspecified: Secondary | ICD-10-CM | POA: Diagnosis present

## 2014-05-28 DIAGNOSIS — Z79899 Other long term (current) drug therapy: Secondary | ICD-10-CM

## 2014-05-28 DIAGNOSIS — S0083XA Contusion of other part of head, initial encounter: Secondary | ICD-10-CM

## 2014-05-28 DIAGNOSIS — M810 Age-related osteoporosis without current pathological fracture: Secondary | ICD-10-CM | POA: Diagnosis present

## 2014-05-28 DIAGNOSIS — M3500 Sicca syndrome, unspecified: Secondary | ICD-10-CM | POA: Diagnosis present

## 2014-05-28 LAB — CBC
HCT: 45 % (ref 36.0–46.0)
Hemoglobin: 15.1 g/dL — ABNORMAL HIGH (ref 12.0–15.0)
MCH: 30.1 pg (ref 26.0–34.0)
MCHC: 33.6 g/dL (ref 30.0–36.0)
MCV: 89.8 fL (ref 78.0–100.0)
PLATELETS: 393 10*3/uL (ref 150–400)
RBC: 5.01 MIL/uL (ref 3.87–5.11)
RDW: 15.2 % (ref 11.5–15.5)
WBC: 12.1 10*3/uL — ABNORMAL HIGH (ref 4.0–10.5)

## 2014-05-28 LAB — COMPREHENSIVE METABOLIC PANEL
ALT: 12 U/L (ref 0–35)
AST: 23 U/L (ref 0–37)
Albumin: 2.8 g/dL — ABNORMAL LOW (ref 3.5–5.2)
Alkaline Phosphatase: 61 U/L (ref 39–117)
Anion gap: 9 (ref 5–15)
BUN: 13 mg/dL (ref 6–23)
CALCIUM: 8.6 mg/dL (ref 8.4–10.5)
CO2: 26 mmol/L (ref 19–32)
Chloride: 103 mmol/L (ref 96–112)
Creatinine, Ser: 0.68 mg/dL (ref 0.50–1.10)
GFR calc Af Amer: 84 mL/min — ABNORMAL LOW (ref >90)
GFR calc non Af Amer: 73 mL/min — ABNORMAL LOW (ref >90)
GLUCOSE: 112 mg/dL — AB (ref 70–99)
Potassium: 3.4 mmol/L — ABNORMAL LOW (ref 3.5–5.1)
Sodium: 138 mmol/L (ref 135–145)
Total Bilirubin: 0.8 mg/dL (ref 0.3–1.2)
Total Protein: 6.5 g/dL (ref 6.0–8.3)

## 2014-05-28 LAB — URINALYSIS, ROUTINE W REFLEX MICROSCOPIC
Bilirubin Urine: NEGATIVE
Glucose, UA: NEGATIVE mg/dL
Hgb urine dipstick: NEGATIVE
Ketones, ur: 40 mg/dL — AB
NITRITE: NEGATIVE
PH: 6.5 (ref 5.0–8.0)
Protein, ur: NEGATIVE mg/dL
Specific Gravity, Urine: 1.019 (ref 1.005–1.030)
UROBILINOGEN UA: 1 mg/dL (ref 0.0–1.0)

## 2014-05-28 LAB — URINE MICROSCOPIC-ADD ON

## 2014-05-28 LAB — CK: Total CK: 105 U/L (ref 7–177)

## 2014-05-28 LAB — PROTIME-INR
INR: 1.67 — ABNORMAL HIGH (ref 0.00–1.49)
PROTHROMBIN TIME: 19.9 s — AB (ref 11.6–15.2)

## 2014-05-28 LAB — DIGOXIN LEVEL: Digoxin Level: <0.2 ng/mL — ABNORMAL LOW (ref 0.8–2.0)

## 2014-05-28 MED ORDER — DILTIAZEM HCL ER COATED BEADS 240 MG PO CP24
240.0000 mg | ORAL_CAPSULE | Freq: Once | ORAL | Status: AC
  Administered 2014-05-28: 240 mg via ORAL
  Filled 2014-05-28: qty 1

## 2014-05-28 MED ORDER — DILTIAZEM HCL 25 MG/5ML IV SOLN
10.0000 mg | Freq: Once | INTRAVENOUS | Status: AC
  Administered 2014-05-28: 10 mg via INTRAVENOUS
  Filled 2014-05-28: qty 5

## 2014-05-28 MED ORDER — POTASSIUM CHLORIDE CRYS ER 20 MEQ PO TBCR
20.0000 meq | EXTENDED_RELEASE_TABLET | Freq: Once | ORAL | Status: AC
  Administered 2014-05-28: 20 meq via ORAL
  Filled 2014-05-28: qty 1

## 2014-05-28 NOTE — H&P (Addendum)
[facility] History and Physical  Patient: [name] [name]  MRN: [id number]  DOB: 03/02/1919  DOS: the patient was seen and examined on 05/28/2014 PCP: Oliver Barre, MD  Chief Complaint: Fall  HPI: [name] [name] is a 81 y.o. female with Past medical history of A. fib on anticoagulation, rheumatoid arthritis, TIA, hypothyroidism, hypertension. The patient is presenting with a fall. Patient lives independently and found herself in the floor of the bathroom. She mentions to me that sometime last night when she went to the bathroom she does not know how he fell and passed out. She did not lose control of bowel or bladder. She did not have any pain in her extremities. She is crawled herself out of the bathroom and called her daughter. Daughter found the patient on the floor and brought her to the ER. Patient denied any changes in her medication any nausea and vomiting any dehydration prior to the fall. There was no significant confusion noted. There was no focal deficit complaint. Patient was started on mirtazapine yesterday increase her appetite and she took 1 dose before the fall.  The patient is coming from home. And at her baseline independent for most of her ADL.  Review of Systems: as mentioned in the history of present illness.  A Comprehensive review of the other systems is negative.  Past Medical History  Diagnosis Date  . Unspecified cerebral artery occlusion with cerebral infarction   . Recurrent cold sores   . Xanthelasma of eyelid(374.51)   . Ganglion cyst   . Anatomical narrow angle borderline glaucoma   . Hypothyroidism   . Hypertension   . Atrial fibrillation   . Sjogren's syndrome   . Goiter   . Rheumatoid arthritis(714.0)   . Osteoporosis   . Impaired glucose tolerance 11/25/2013   Past Surgical History  Procedure Laterality Date  . Appendectomy    . Vaginal cyst removed  1998    Social History:  reports that she has never smoked. She does not have any  smokeless tobacco history on file. She reports that she does not drink alcohol or use illicit drugs.  Allergies  Allergen Reactions  . Hydroxychloroquine Sulfate Itching and Rash    No family history on file.  Prior to Admission medications   Medication Sig Start Date End Date Taking? Authorizing Provider  Cholecalciferol (VITAMIN D3) 2000 UNITS TABS Take 2,000 Units by mouth daily.   Yes Historical Provider, MD  digoxin (DIGOX) 0.125 MG tablet Take 1 tablet (0.125 mg total) by mouth daily. 12/01/13  Yes Rollene Rotunda, MD  diltiazem (CARDIZEM CD) 240 MG 24 hr capsule Take 1 capsule (240 mg total) by mouth daily. 12/01/13  Yes Rollene Rotunda, MD  folic acid (FOLVITE) 1 MG tablet Take 1 tablet (1 mg total) by mouth daily. 05/27/13  Yes Corwin Levins, MD  hydrochlorothiazide (HYDRODIURIL) 25 MG tablet Take 0.5 tablets (12.5 mg total) by mouth daily. 11/25/13  Yes Corwin Levins, MD  levothyroxine (SYNTHROID, LEVOTHROID) 75 MCG tablet Take 1 tablet (75 mcg total) by mouth daily. 11/18/13  Yes Corwin Levins, MD  methotrexate 2.5 MG tablet Take 20 mg by mouth once a week. Takes on Wednesday   Yes Historical Provider, MD  mirtazapine (REMERON) 15 MG tablet Take 1 tablet (15 mg total) by mouth at bedtime. 05/27/14  Yes Corwin Levins, MD  predniSONE (DELTASONE) 5 MG tablet Take 5 mg by mouth daily.     Yes Historical Provider, MD  warfarin (COUMADIN)  5 MG tablet Take as directed by anticoagulation clinic Patient taking differently: Take 2.5-5 mg by mouth daily at 6 PM. Takes 5mg  on Wednesday  Takes 2.5mg  all other days 09/10/13  Yes Corwin Levins, MD    Physical Exam: Filed Vitals:   05/28/14 2100 05/28/14 2249 05/28/14 2250 05/28/14 2254  BP: 138/61 133/77  133/77  Pulse: 96  88   Temp:      TempSrc:      Resp: 23  24   SpO2: 97%  100%     General: Alert, Awake and Oriented to Time, Place and Person. Appear in mild distress Eyes: PERRL ENT: Oral Mucosa clear moist. Neck: no JVD Cardiovascular: S1  and S2 Present, no Murmur, Peripheral Pulses Present Respiratory: Bilateral Air entry equal and Decreased, Clear to Auscultation, noCrackles, no wheezes Abdomen: Bowel Sound present, Soft and non tender Skin: no Rash Extremities: no Pedal edema, no calf tenderness Neurologic: Grossly no focal neuro deficit.  Labs on Admission:  CBC:  Recent Labs Lab 05/28/14 1700  WBC 12.1*  HGB 15.1*  HCT 45.0  MCV 89.8  PLT 393    CMP     Component Value Date/Time   NA 138 05/28/2014 1955   K 3.4* 05/28/2014 1955   CL 103 05/28/2014 1955   CO2 26 05/28/2014 1955   GLUCOSE 112* 05/28/2014 1955   BUN 13 05/28/2014 1955   CREATININE 0.68 05/28/2014 1955   CALCIUM 8.6 05/28/2014 1955   PROT 6.5 05/28/2014 1955   ALBUMIN 2.8* 05/28/2014 1955   AST 23 05/28/2014 1955   ALT 12 05/28/2014 1955   ALKPHOS 61 05/28/2014 1955   BILITOT 0.8 05/28/2014 1955   GFRNONAA 73* 05/28/2014 1955   GFRAA 84* 05/28/2014 1955    No results for input(s): LIPASE, AMYLASE in the last 168 hours.   Recent Labs Lab 05/28/14 1955  CKTOTAL 105   BNP (last 3 results) No results for input(s): BNP in the last 8760 hours.  ProBNP (last 3 results) No results for input(s): PROBNP in the last 8760 hours.   Radiological Exams on Admission: Dg Sacrum/coccyx  05/28/2014   CLINICAL DATA:  Fall, tail bone pain.  EXAM: SACRUM AND COCCYX - 2+ VIEW  COMPARISON:  None  FINDINGS: Osteopenia and overlying bowel limits assessment. A nondisplaced distal sacral fracture is suspected though age-indeterminate. No overt SI joint widening. Atherosclerotic vascular calcifications. Degenerative changes of the lumbar spine.  IMPRESSION: A nondisplaced distal sacral fracture is suspected though age-indeterminate. If symptoms warrant, correlate with cross-sectional imaging.   Electronically Signed   By: Jearld Lesch M.D.   On: 05/28/2014 23:06   Ct Head Wo Contrast  05/28/2014   CLINICAL DATA:  89yof, fell onto the floor tile in  the bathroom this am. Bruising to the rt superior orbital area. unkown loc. Hx of A-fib, HTN, collagen vascular dz, Unspecified cerebral artery occlusion with cerebral infarction; Recurrent cold sores; Xanthelasma of eyelid(374.51); Ganglion cyst; Anatomical narrow angle borderline glaucoma; Hypothyroidism; Atrial fibrillation; Goiter; Rheumatoid arthritis(714.0); Osteoporosis; Impaired glucose tolerance  EXAM: CT HEAD WITHOUT CONTRAST  TECHNIQUE: Contiguous axial images were obtained from the base of the skull through the vertex without intravenous contrast.  COMPARISON:  Brain MRI and head CT, 12/15/2009  FINDINGS: Ventricles are normal in configuration. There is ventricular and sulcal enlargement reflecting age related atrophy. No hydrocephalus.  No parenchymal masses or mass effect. Patchy areas of white matter hypoattenuation noted consistent with mild to moderate chronic microvascular ischemic change, stable. There  is no evidence of a cortical infarct.  There are no extra-axial masses or abnormal fluid collections.  No intracranial hemorrhage.  Sinuses and mastoid air cells are clear.  No skull lesion.  IMPRESSION: 1. No acute findings. 2. Age related atrophy. Mild to moderate chronic microvascular ischemic change. 3. Stable appearance from the prior head CT.   Electronically Signed   By: Amie Portland M.D.   On: 05/28/2014 18:05   EKG: Independently reviewed. atrial fibrillation, rate RVR.  Assessment/Plan Principal Problem:   Atrial fibrillation Active Problems:   Hypothyroidism   Essential hypertension   Rheumatoid arthritis   Long term current use of anticoagulant therapy   Sacral fracture   Scalp hematoma   UTI (urinary tract infection)   Syncope   1. Atrial fibrillation The patient presents with fall. She also initially presented with A. fib with RVR. She was given Cardizem bolus and then was given Cardizem regular home dose. Currently the patient is normal sinus rhythm and rate  control in 90s. Continue close monitoring. Continue Cardizem. Holding warfarin. Resume her H&H remained stable.  2. Fall The patient had a fall which was not witnessed. This happened after her first dose of Remeron. Possibility that she dozed off after the dose of medication. Also possibility of syncope secondary to A. fib cannot be ruled out. Patient will be given gentle IV hydration. Monitor on telemetry. We get PTOT evaluation in the morning. Hold Remeron.  3. Possible UTI. Patient does have some pyuria and presented with fall and some confusion. With this the patient will be treated for possible UTI. Follow cultures.  4. Scalp hematoma. Holding warfarin and monitor H&H if H&H remains stable and the hematoma remained stable resume warfarin. Indication only is A. fib.  5. Sacral fracture. Conservative management. PTOT consultation. May require orthopedic follow-up as an outpatient.  Advance goals of care discussion: Full code as per my discussion with patient. Patient has power of attorney   DVT Prophylaxis: mechanical compression device Nutrition: Cardiac diet  Disposition: Admitted to observationin telemetry unit.  Author: Lynden Oxford, MD [facility] Pager: [phone] 05/28/2014, 11:44 PM    If 7PM-7AM, please contact night-coverage [email or web address] Password [email or web address]

## 2014-05-28 NOTE — ED Notes (Signed)
Called lab to confirm receipt of urine sample. Lab personnel confirmed.

## 2014-05-28 NOTE — ED Provider Notes (Addendum)
CSN: [id number]     Arrival date & time 05/28/14  1636 History   First MD Initiated Contact with Patient 05/28/14 1643     Chief Complaint  Patient presents with  . Fall     (Consider location/radiation/quality/duration/timing/severity/associated sxs/prior Treatment) Patient is a 81 y.o. female presenting with fall. The history is provided by the patient. The history is limited by the condition of the patient.  Fall Pertinent negatives include no chest pain, no abdominal pain, no headaches and no shortness of breath.  pt s/p fall at home this morning ?7am, pt unsure. Pt lives independently. Was on toilet, states she doesn't know what caused her to fall. States she ended up on ground.  Initially said 7 or 8 am this morning, but then states 7 or 8 last night - pt very poor historian w poor recollection of events. Contusion to right forehead. Pt is on coumadin, hx afib. Denies recent abnormal bleeding or bruising.  Pt states was able to get from bathroom into bedroom, get phone and call daughter. Daughter found in bedroom on floor, unable to get up without assistance. Other than contusion to right forehead, pt denies other pain or injury. No headache. No neck or back pain. No cp or sob. No abd pain or nv. Denies extremity pain or injury. No fever or chills. Only recent change in med was addition mirtazepine yesterday to help w appetite, took 1 dose. Pt denies fainting. No palpitations.     Past Medical History  Diagnosis Date  . Unspecified cerebral artery occlusion with cerebral infarction   . Recurrent cold sores   . Xanthelasma of eyelid(374.51)   . Ganglion cyst   . Anatomical narrow angle borderline glaucoma   . Hypothyroidism   . Hypertension   . Atrial fibrillation   . Sjogren's syndrome   . Goiter   . Rheumatoid arthritis(714.0)   . Osteoporosis   . Impaired glucose tolerance 11/25/2013   Past Surgical History  Procedure Laterality Date  . Appendectomy    . Vaginal cyst  removed  1998    No family history on file. History  Substance Use Topics  . Smoking status: Never Smoker   . Smokeless tobacco: Not on file  . Alcohol Use: No   OB History    No data available     Review of Systems  Constitutional: Negative for fever and chills.  HENT: Negative for sore throat.   Eyes: Negative for visual disturbance.  Respiratory: Negative for cough and shortness of breath.   Cardiovascular: Negative for chest pain.  Gastrointestinal: Negative for vomiting, abdominal pain, diarrhea and blood in stool.  Genitourinary: Negative for flank pain.  Musculoskeletal: Negative for back pain and neck pain.  Skin: Negative for wound.  Neurological: Positive for syncope. Negative for weakness, numbness and headaches.       Unclear if syncope or mech fall - pt unsure.   Hematological: Does not bruise/bleed easily.  Psychiatric/Behavioral: Negative for confusion.      Allergies  Hydroxychloroquine sulfate  Home Medications   Prior to Admission medications   Medication Sig Start Date End Date Taking? Authorizing Provider  digoxin (DIGOX) 0.125 MG tablet Take 1 tablet (0.125 mg total) by mouth daily. 12/01/13   Rollene Rotunda, MD  diltiazem (CARDIZEM CD) 240 MG 24 hr capsule Take 1 capsule (240 mg total) by mouth daily. 12/01/13   Rollene Rotunda, MD  folic acid (FOLVITE) 1 MG tablet Take 1 tablet (1 mg total) by mouth daily. 05/27/13  Corwin Levins, MD  hydrochlorothiazide (HYDRODIURIL) 25 MG tablet Take 0.5 tablets (12.5 mg total) by mouth daily. 11/25/13   Corwin Levins, MD  levothyroxine (SYNTHROID, LEVOTHROID) 75 MCG tablet Take 1 tablet (75 mcg total) by mouth daily. 11/18/13   Corwin Levins, MD  methotrexate 2.5 MG tablet Take 15 mg by mouth once a week. Takes on Wednesday    Historical Provider, MD  mirtazapine (REMERON) 15 MG tablet Take 1 tablet (15 mg total) by mouth at bedtime. 05/27/14   Corwin Levins, MD  predniSONE (DELTASONE) 5 MG tablet Take 5 mg by mouth daily.       Historical Provider, MD  warfarin (COUMADIN) 5 MG tablet Take as directed by anticoagulation clinic 09/10/13   Corwin Levins, MD   BP 166/54 mmHg  Pulse 96  Temp(Src) 97.6 F (36.4 C) (Oral)  Resp 18  SpO2 100% Physical Exam  Constitutional: She appears well-developed and well-nourished. No distress.  HENT:  Large contusion right forehead.   Eyes: Conjunctivae are normal. Pupils are equal, round, and reactive to light. No scleral icterus.  Neck: Normal range of motion. Neck supple. No tracheal deviation present.  No bruit  Cardiovascular: Normal rate, regular rhythm, normal heart sounds and intact distal pulses.   Pulmonary/Chest: Effort normal and breath sounds normal. No respiratory distress. She exhibits no tenderness.  Abdominal: Soft. Normal appearance and bowel sounds are normal. She exhibits no distension and no mass. There is no tenderness. There is no rebound and no guarding.  No puls mass  Genitourinary:  No cva tenderness  Musculoskeletal: She exhibits no edema.  CTLS spine, non tender, aligned, no step off. Good rom bil ext without pain or focal bony tenderness. Distal pulses palp bil.   Neurological: She is alert.  Mildly hoh. Speech clear. Pt alert, responds to questions appropriately. Mental status c/w baseline per family. Motor intact bil. stre 5/5. sens grossly intact.   Skin: Skin is warm and dry. No rash noted. She is not diaphoretic.  Psychiatric: She has a normal mood and affect.  Nursing note and vitals reviewed.   ED Course  Procedures (including critical care time) Labs Review  Results for orders placed or performed during the hospital encounter of 05/28/14  Protime-INR  Result Value Ref Range   Prothrombin Time 19.9 (H) 11.6 - 15.2 seconds   INR 1.67 (H) 0.00 - 1.49  CBC  Result Value Ref Range   WBC 12.1 (H) 4.0 - 10.5 K/uL   RBC 5.01 3.87 - 5.11 MIL/uL   Hemoglobin 15.1 (H) 12.0 - 15.0 g/dL   HCT 11.1 73.5 - 67.0 %   MCV 89.8 78.0 - 100.0 fL    MCH 30.1 26.0 - 34.0 pg   MCHC 33.6 30.0 - 36.0 g/dL   RDW 14.1 03.0 - 13.1 %   Platelets 393 150 - 400 K/uL  Urinalysis, Routine w reflex microscopic  Result Value Ref Range   Color, Urine YELLOW YELLOW   APPearance CLOUDY (A) CLEAR   Specific Gravity, Urine 1.019 1.005 - 1.030   pH 6.5 5.0 - 8.0   Glucose, UA NEGATIVE NEGATIVE mg/dL   Hgb urine dipstick NEGATIVE NEGATIVE   Bilirubin Urine NEGATIVE NEGATIVE   Ketones, ur 40 (A) NEGATIVE mg/dL   Protein, ur NEGATIVE NEGATIVE mg/dL   Urobilinogen, UA 1.0 0.0 - 1.0 mg/dL   Nitrite NEGATIVE NEGATIVE   Leukocytes, UA SMALL (A) NEGATIVE  CK  Result Value Ref Range   Total CK  105 7 - 177 U/L  Comprehensive metabolic panel  Result Value Ref Range   Sodium 138 135 - 145 mmol/L   Potassium 3.4 (L) 3.5 - 5.1 mmol/L   Chloride 103 96 - 112 mmol/L   CO2 26 19 - 32 mmol/L   Glucose, Bld 112 (H) 70 - 99 mg/dL   BUN 13 6 - 23 mg/dL   Creatinine, Ser 5.36 0.50 - 1.10 mg/dL   Calcium 8.6 8.4 - 14.4 mg/dL   Total Protein 6.5 6.0 - 8.3 g/dL   Albumin 2.8 (L) 3.5 - 5.2 g/dL   AST 23 0 - 37 U/L   ALT 12 0 - 35 U/L   Alkaline Phosphatase 61 39 - 117 U/L   Total Bilirubin 0.8 0.3 - 1.2 mg/dL   GFR calc non Af Amer 73 (L) >90 mL/min   GFR calc Af Amer 84 (L) >90 mL/min   Anion gap 9 5 - 15  Urine microscopic-add on  Result Value Ref Range   Squamous Epithelial / LPF RARE RARE   WBC, UA 7-10 <3 WBC/hpf   RBC / HPF 0-2 <3 RBC/hpf   Bacteria, UA RARE RARE   Ct Head Wo Contrast  05/28/2014   CLINICAL DATA:  89yof, fell onto the floor tile in the bathroom this am. Bruising to the rt superior orbital area. unkown loc. Hx of A-fib, HTN, collagen vascular dz, Unspecified cerebral artery occlusion with cerebral infarction; Recurrent cold sores; Xanthelasma of eyelid(374.51); Ganglion cyst; Anatomical narrow angle borderline glaucoma; Hypothyroidism; Atrial fibrillation; Goiter; Rheumatoid arthritis(714.0); Osteoporosis; Impaired glucose tolerance   EXAM: CT HEAD WITHOUT CONTRAST  TECHNIQUE: Contiguous axial images were obtained from the base of the skull through the vertex without intravenous contrast.  COMPARISON:  Brain MRI and head CT, 12/15/2009  FINDINGS: Ventricles are normal in configuration. There is ventricular and sulcal enlargement reflecting age related atrophy. No hydrocephalus.  No parenchymal masses or mass effect. Patchy areas of white matter hypoattenuation noted consistent with mild to moderate chronic microvascular ischemic change, stable. There is no evidence of a cortical infarct.  There are no extra-axial masses or abnormal fluid collections.  No intracranial hemorrhage.  Sinuses and mastoid air cells are clear.  No skull lesion.  IMPRESSION: 1. No acute findings. 2. Age related atrophy. Mild to moderate chronic microvascular ischemic change. 3. Stable appearance from the prior head CT.   Electronically Signed   By: Amie Portland M.D.   On: 05/28/2014 18:05        EKG Interpretation   Date/Time:  Friday May 28 2014 16:44:03 EDT Ventricular Rate:  100 PR Interval:    QRS Duration: 76 QT Interval:  358 QTC Calculation: 462 R Axis:   -59 Text Interpretation:  Atrial fibrillation Left axis deviation Left  anterior fascicular block Confirmed by Denton Lank  MD, Caryn Bee ([id number]) on  05/28/2014 4:53:53 PM      MDM   Iv ns. Labs. Monitor.  Reviewed nursing notes and prior charts for additional history.   Unclear from history if syncope vs mech fall.   Pt reports lying on floor for extended period.  On recheck, bruising about sacral area, tenderness to area, will get films. No spine tenderness.   Recheck, pts hr 130's, afib. No cp or sob.  Pts daughter report pt likely without meds all day. Pt denies taking any meds today. cardizem 10 mg iv. cardizem 240 mg po (pts po dose).  Given unclear reason fall, ?syncope, now w rapid afib - will  admit.   Hospitalist paged.  Pt/family indicate code status dnr, no cpr, no  intubation.       Cathren Laine, MD 05/28/14 2231

## 2014-05-28 NOTE — ED Notes (Signed)
Received call from main lab stating gross homolysis on replacement sample. Phlebotomy informed and to re-draw.

## 2014-05-28 NOTE — ED Notes (Signed)
Pt presents via [facility] for a fall this morning at 7 am.  Pt lives alone and fell in the bathroom on the tile.  Pt is on coumadin for Afib.  Pt with bruising to r side of forehead and L arm.  LOC is questionable/unknown.  Pt denies pain, no deformities noted.  Pt a x 4 on arrival, NAD.  BP-184/86 P-90/115 O2-100% RA, CBG-106.

## 2014-05-29 DIAGNOSIS — S0003XA Contusion of scalp, initial encounter: Secondary | ICD-10-CM | POA: Diagnosis present

## 2014-05-29 DIAGNOSIS — S3210XA Unspecified fracture of sacrum, initial encounter for closed fracture: Secondary | ICD-10-CM | POA: Diagnosis present

## 2014-05-29 DIAGNOSIS — R55 Syncope and collapse: Secondary | ICD-10-CM | POA: Diagnosis present

## 2014-05-29 DIAGNOSIS — I5022 Chronic systolic (congestive) heart failure: Secondary | ICD-10-CM | POA: Diagnosis present

## 2014-05-29 LAB — COMPREHENSIVE METABOLIC PANEL
ALBUMIN: 2.3 g/dL — AB (ref 3.5–5.2)
ALK PHOS: 50 U/L (ref 39–117)
ALT: 9 U/L (ref 0–35)
AST: 23 U/L (ref 0–37)
Anion gap: 6 (ref 5–15)
BUN: 16 mg/dL (ref 6–23)
CO2: 26 mmol/L (ref 19–32)
Calcium: 8.2 mg/dL — ABNORMAL LOW (ref 8.4–10.5)
Chloride: 105 mmol/L (ref 96–112)
Creatinine, Ser: 0.74 mg/dL (ref 0.50–1.10)
GFR, EST AFRICAN AMERICAN: 82 mL/min — AB (ref >90)
GFR, EST NON AFRICAN AMERICAN: 71 mL/min — AB (ref >90)
Glucose, Bld: 104 mg/dL — ABNORMAL HIGH (ref 70–99)
Potassium: 3.4 mmol/L — ABNORMAL LOW (ref 3.5–5.1)
SODIUM: 137 mmol/L (ref 135–145)
Total Bilirubin: 1 mg/dL (ref 0.3–1.2)
Total Protein: 5.5 g/dL — ABNORMAL LOW (ref 6.0–8.3)

## 2014-05-29 LAB — CBC
HEMATOCRIT: 41.5 % (ref 36.0–46.0)
Hemoglobin: 13.2 g/dL (ref 12.0–15.0)
MCH: 29.1 pg (ref 26.0–34.0)
MCHC: 31.8 g/dL (ref 30.0–36.0)
MCV: 91.4 fL (ref 78.0–100.0)
Platelets: 371 10*3/uL (ref 150–400)
RBC: 4.54 MIL/uL (ref 3.87–5.11)
RDW: 15 % (ref 11.5–15.5)
WBC: 9.1 10*3/uL (ref 4.0–10.5)

## 2014-05-29 LAB — BRAIN NATRIURETIC PEPTIDE: B Natriuretic Peptide: 175.7 pg/mL — ABNORMAL HIGH (ref 0.0–100.0)

## 2014-05-29 LAB — PROTIME-INR
INR: 1.72 — AB (ref 0.00–1.49)
Prothrombin Time: 20.3 seconds — ABNORMAL HIGH (ref 11.6–15.2)

## 2014-05-29 MED ORDER — DIGOXIN 125 MCG PO TABS
0.1250 mg | ORAL_TABLET | Freq: Every day | ORAL | Status: DC
  Administered 2014-05-29 – 2014-05-30 (×2): 0.125 mg via ORAL
  Filled 2014-05-29 (×2): qty 1

## 2014-05-29 MED ORDER — CEFTRIAXONE SODIUM IN DEXTROSE 20 MG/ML IV SOLN
1.0000 g | INTRAVENOUS | Status: DC
  Administered 2014-05-29: 1 g via INTRAVENOUS
  Filled 2014-05-29: qty 50

## 2014-05-29 MED ORDER — DIGOXIN 250 MCG PO TABS
0.2500 mg | ORAL_TABLET | Freq: Once | ORAL | Status: AC
  Administered 2014-05-29: 0.25 mg via ORAL
  Filled 2014-05-29: qty 1

## 2014-05-29 MED ORDER — ENSURE ENLIVE PO LIQD
237.0000 mL | Freq: Every day | ORAL | Status: DC
  Administered 2014-05-29: 237 mL via ORAL

## 2014-05-29 MED ORDER — WARFARIN SODIUM 5 MG PO TABS
5.0000 mg | ORAL_TABLET | Freq: Once | ORAL | Status: AC
  Administered 2014-05-29: 5 mg via ORAL
  Filled 2014-05-29: qty 1

## 2014-05-29 MED ORDER — ACETAMINOPHEN 650 MG RE SUPP: 650.0000 mg | Freq: Four times a day (QID) | RECTAL | Status: DC | PRN

## 2014-05-29 MED ORDER — PREDNISONE 5 MG PO TABS
5.0000 mg | ORAL_TABLET | Freq: Every day | ORAL | Status: DC
  Administered 2014-05-29 – 2014-05-30 (×2): 5 mg via ORAL
  Filled 2014-05-29 (×3): qty 1

## 2014-05-29 MED ORDER — DILTIAZEM HCL ER COATED BEADS 240 MG PO CP24
240.0000 mg | ORAL_CAPSULE | Freq: Every day | ORAL | Status: DC
  Administered 2014-05-29 – 2014-05-30 (×2): 240 mg via ORAL
  Filled 2014-05-29 (×2): qty 1

## 2014-05-29 MED ORDER — ACETAMINOPHEN 325 MG PO TABS
650.0000 mg | ORAL_TABLET | Freq: Four times a day (QID) | ORAL | Status: DC | PRN
Start: 2014-05-28 — End: 2014-05-30
  Administered 2014-05-29: 650 mg via ORAL
  Filled 2014-05-29: qty 2

## 2014-05-29 MED ORDER — LEVOTHYROXINE SODIUM 75 MCG PO TABS
75.0000 ug | ORAL_TABLET | Freq: Every day | ORAL | Status: DC
  Administered 2014-05-29 – 2014-05-30 (×2): 75 ug via ORAL
  Filled 2014-05-29 (×3): qty 1

## 2014-05-29 MED ORDER — ONDANSETRON HCL 4 MG/2ML IJ SOLN: 4.0000 mg | Freq: Four times a day (QID) | INTRAMUSCULAR | Status: DC | PRN

## 2014-05-29 MED ORDER — ONDANSETRON HCL 4 MG PO TABS: 4.0000 mg | ORAL_TABLET | Freq: Four times a day (QID) | ORAL | Status: DC | PRN

## 2014-05-29 MED ORDER — SODIUM CHLORIDE 0.9 % IV SOLN
INTRAVENOUS | Status: DC
  Administered 2014-05-29 (×2): via INTRAVENOUS

## 2014-05-29 MED ORDER — WARFARIN - PHARMACIST DOSING INPATIENT
Freq: Every day | Status: DC
  Administered 2014-05-29: 18:00:00

## 2014-05-29 MED ORDER — FOLIC ACID 1 MG PO TABS
1.0000 mg | ORAL_TABLET | Freq: Every day | ORAL | Status: DC
  Administered 2014-05-29 – 2014-05-30 (×2): 1 mg via ORAL
  Filled 2014-05-29 (×2): qty 1

## 2014-05-29 MED ORDER — SODIUM CHLORIDE 0.9 % IJ SOLN
3.0000 mL | Freq: Two times a day (BID) | INTRAMUSCULAR | Status: DC
  Administered 2014-05-29 (×2): 3 mL via INTRAVENOUS

## 2014-05-29 NOTE — Progress Notes (Signed)
PROGRESS NOTE  [name] [name] UTM:[id number] DOB: [birth year] DOA: 05/28/2014 PCP: Oliver Barre, MD  HPI/Recap of past 67 hours: 81 year old female with atrial fibrillation on chronic Coumadin and chronic systolic heart failure, mostly ambulatory with use of a walker unclear on events, but without warning she passed out while she was in the bathroom. Found later with bruising around left side of head and taken to emergency room. Patient awake and alert. No evidence of seizure activity. Found to have small sacral fracture. Hospitalist call for further evaluation.  Assessment/Plan: Principal Problem:   Vasovagal syncope: Given history, suspicious for vasovagal syncope. Continue telemetry, gently hydrate and likely discharge tomorrow Active Problems:   Hypothyroidism: Stable on Synthroid   Essential hypertension: Stable continue home meds   Atrial fibrillation: Stable. Rate controlled. Digoxin level subtherapeutic so additional loading dose of digoxin given   Rheumatoid arthritis: Stable   Long term current use of anticoagulant therapy: INR subtherapeutic, no evidence of active bleeding. Pharmacy to restart Coumadin   Sacral fracture: PT to see. Non-op type of fracture. Likely will need home health   Scalp hematoma: Stable no active bleeding   Chronic systolic heart failure: No evidence of acute volume overload. Checking BNP   Code Status: DNR  Family Communication: Daughter & Son-in-law at the bedside  Disposition Plan: Likely home tomorrow   Consultants:  None  Procedures:  none  Antibiotics:  IV Rocephin x 1 dose   Objective: BP 120/46 mmHg  Pulse 62  Temp(Src) 97.9 F (36.6 C) (Oral)  Resp 23  Ht 5\' 1"  (1.549 m)  Wt 45.7 kg (100 lb 12 oz)  BMI 19.05 kg/m2  SpO2 98%  Intake/Output Summary (Last 24 hours) at 05/29/14 1603 Last data filed at 05/29/14 1300  Gross per 24 hour  Intake    360 ml  Output    300 ml  Net     60 ml   Filed Weights   05/29/14 0041  05/29/14 0502  Weight: 45.723 kg (100 lb 12.8 oz) 45.7 kg (100 lb 12 oz)    Exam:   General:  Alert and oriented 3, bruising around left side forehead  Cardiovascular: Irregular rhythm, 2/6 systolic ejection murmur  Respiratory: Clear to auscultation bilaterally  Abdomen: Soft, nontender, nondistended, hypoactive bowel sounds  Musculoskeletal: No clubbing or cyanosis or edema   Data Reviewed: Basic Metabolic Panel:  Recent Labs Lab 05/28/14 1955 05/29/14 0351  NA 138 137  K 3.4* 3.4*  CL 103 105  CO2 26 26  GLUCOSE 112* 104*  BUN 13 16  CREATININE 0.68 0.74  CALCIUM 8.6 8.2*   Liver Function Tests:  Recent Labs Lab 05/28/14 1955 05/29/14 0351  AST 23 23  ALT 12 9  ALKPHOS 61 50  BILITOT 0.8 1.0  PROT 6.5 5.5*  ALBUMIN 2.8* 2.3*   No results for input(s): LIPASE, AMYLASE in the last 168 hours. No results for input(s): AMMONIA in the last 168 hours. CBC:  Recent Labs Lab 05/28/14 1700 05/29/14 0351  WBC 12.1* 9.1  HGB 15.1* 13.2  HCT 45.0 41.5  MCV 89.8 91.4  PLT 393 371   Cardiac Enzymes:    Recent Labs Lab 05/28/14 1955  CKTOTAL 105   BNP (last 3 results) No results for input(s): BNP in the last 8760 hours.  ProBNP (last 3 results) No results for input(s): PROBNP in the last 8760 hours.  CBG: No results for input(s): GLUCAP in the last 168 hours.  No results found for this  or any previous visit (from the past 240 hour(s)).   Studies: Dg Sacrum/coccyx  05/28/2014   CLINICAL DATA:  Fall, tail bone pain.  EXAM: SACRUM AND COCCYX - 2+ VIEW  COMPARISON:  None  FINDINGS: Osteopenia and overlying bowel limits assessment. A nondisplaced distal sacral fracture is suspected though age-indeterminate. No overt SI joint widening. Atherosclerotic vascular calcifications. Degenerative changes of the lumbar spine.  IMPRESSION: A nondisplaced distal sacral fracture is suspected though age-indeterminate. If symptoms warrant, correlate with  cross-sectional imaging.   Electronically Signed   By: Jearld Lesch M.D.   On: 05/28/2014 23:06   Ct Head Wo Contrast  05/28/2014   CLINICAL DATA:  89yof, fell onto the floor tile in the bathroom this am. Bruising to the rt superior orbital area. unkown loc. Hx of A-fib, HTN, collagen vascular dz, Unspecified cerebral artery occlusion with cerebral infarction; Recurrent cold sores; Xanthelasma of eyelid(374.51); Ganglion cyst; Anatomical narrow angle borderline glaucoma; Hypothyroidism; Atrial fibrillation; Goiter; Rheumatoid arthritis(714.0); Osteoporosis; Impaired glucose tolerance  EXAM: CT HEAD WITHOUT CONTRAST  TECHNIQUE: Contiguous axial images were obtained from the base of the skull through the vertex without intravenous contrast.  COMPARISON:  Brain MRI and head CT, 12/15/2009  FINDINGS: Ventricles are normal in configuration. There is ventricular and sulcal enlargement reflecting age related atrophy. No hydrocephalus.  No parenchymal masses or mass effect. Patchy areas of white matter hypoattenuation noted consistent with mild to moderate chronic microvascular ischemic change, stable. There is no evidence of a cortical infarct.  There are no extra-axial masses or abnormal fluid collections.  No intracranial hemorrhage.  Sinuses and mastoid air cells are clear.  No skull lesion.  IMPRESSION: 1. No acute findings. 2. Age related atrophy. Mild to moderate chronic microvascular ischemic change. 3. Stable appearance from the prior head CT.   Electronically Signed   By: Amie Portland M.D.   On: 05/28/2014 18:05    Scheduled Meds: . digoxin  0.125 mg Oral Daily  . diltiazem  240 mg Oral Daily  . feeding supplement (ENSURE ENLIVE)  237 mL Oral Q1500  . folic acid  1 mg Oral Daily  . levothyroxine  75 mcg Oral QAC breakfast  . predniSONE  5 mg Oral Q breakfast  . sodium chloride  3 mL Intravenous Q12H  . warfarin  5 mg Oral ONCE-1800  . Warfarin - Pharmacist Dosing Inpatient   Does not apply  q1800    Continuous Infusions: . sodium chloride 75 mL/hr at 05/29/14 0056     Time spent: 25 min  Hollice Espy  [facility] Pager [phone]. If 7PM-7AM, please contact night-coverage at [email or web address], password [facility] 05/29/2014, 4:03 PM  LOS: 1 day

## 2014-05-29 NOTE — Care Management Note (Addendum)
    Page 1 of 2   05/30/2014     4:20:19 PM CARE MANAGEMENT NOTE 05/30/2014  Patient:  LAKEITHIA, RASOR   Account Number:  [id number]  Date Initiated:  05/29/2014  Documentation initiated by:  [facility]  Subjective/Objective Assessment:   adm: fall.     Action/Plan:   discharge planning  PT LIVES ALONE   Anticipated DC Date:  05/31/2014   Anticipated DC Plan:  HOME W HOME HEALTH SERVICES  In-house referral  Clinical Social Worker      DC Associate Professor  CM consult      [facility] Choice  HOME HEALTH   Choice offered to / List presented to:  C-4 Adult Children        HH arranged  HH-2 PT  HH-6 SOCIAL WORKER      HH agency  [facility]   Status of service:  Completed, signed off Medicare Important Message given?   (If response is "NO", the following Medicare IM given date fields will be blank) Date Medicare IM given:   Medicare IM given by:   Date Additional Medicare IM given:   Additional Medicare IM given by:    Discharge Disposition:  HOME W HOME HEALTH SERVICES  Per UR Regulation:    If discussed at Long Length of Stay Meetings, dates discussed:    Comments:  05/30/14 16:00 CM spoke with the pt and then with daughter to confirm HHPT/SW arrangements.  Confirmed by both.  CM called [facility] rep, Judeth Cornfield to notify of discharge.  No other CM needs were communicated.  Freddy Jaksch, BSN, [phone].  05/29/14 13:00 CM spoke with pt, daughter Sherryll Burger [phone], and son-in-law at length exploring post discharge options.  Family verbalizes understanding private caregivers would be an out-of-pocket expense and HH services provided by [facility] (who they have had in the past and prefer if they choose [facility]) will not provide 24 our supervision.  CM will continue to monitor for disposition as pt progresses.  Freddy Jaksch, BSN, CM [phone].

## 2014-05-29 NOTE — Progress Notes (Signed)
Patient arrived to the floor. No complaints at this time. Tele box placed and CCMD called. Vitals taken. Patient oriented to unit and room. Patient educated on high fall risk protocol. Bed alarm on. Call bell within reach. Will continue to monitor.  Valinda Hoar RN

## 2014-05-29 NOTE — Progress Notes (Signed)
ANTICOAGULATION CONSULT NOTE - Initial Consult  Pharmacy Consult for Coumadin Indication: atrial fibrillation  Allergies  Allergen Reactions  . Hydroxychloroquine Sulfate Itching and Rash    Patient Measurements: Height: 5\' 1"  (154.9 cm) Weight: 100 lb 12 oz (45.7 kg) IBW/kg (Calculated) : 47.8  Vital Signs: Temp: 97.9 F (36.6 C) (03/19 0502) Temp Source: Oral (03/19 0502) BP: 131/73 mmHg (03/19 0502) Pulse Rate: 89 (03/19 0502)  Labs:  Recent Labs  05/28/14 1700 05/28/14 1955 05/29/14 0351  HGB 15.1*  --  13.2  HCT 45.0  --  41.5  PLT 393  --  371  LABPROT 19.9*  --  20.3*  INR 1.67*  --  1.72*  CREATININE  --  0.68 0.74  CKTOTAL  --  105  --     Estimated Creatinine Clearance: 31 mL/min (by C-G formula based on Cr of 0.74).   Medical History: Past Medical History  Diagnosis Date  . Unspecified cerebral artery occlusion with cerebral infarction   . Recurrent cold sores   . Xanthelasma of eyelid(374.51)   . Ganglion cyst   . Anatomical narrow angle borderline glaucoma   . Hypothyroidism   . Hypertension   . Atrial fibrillation   . Sjogren's syndrome   . Goiter   . Rheumatoid arthritis(714.0)   . Osteoporosis   . Impaired glucose tolerance 11/25/2013    Assessment: 81 year old female on Coumadin PTA for Afib Admit INR = 1.67 on 5 mg on Wednesdays, 2.5 mg other days S/p fall at home with scalp hematoma  Goal of Therapy:  INR 2-3 Monitor platelets by anticoagulation protocol: Yes   Plan:  Coumadin 5 mg po x 1 dose tonight Daily INR  Thank you. 04-05-1992, PharmD [phone]  05/29/2014,12:49 PM

## 2014-05-29 NOTE — Evaluation (Signed)
Physical Therapy Evaluation Patient Details Name: [name] [name] MRN: [id number] DOB: 01-23-1919 Today's Date: 05/29/2014   History of Present Illness  [name] [name] is a 81 y.o. female with Past medical history of A. fib on anticoagulation, rheumatoid arthritis, TIA, hypothyroidism, hypertension. The patient is presenting with a fall, A. fib with RVR, and sacral fracture.  Clinical Impression  Pt admitted with the above complications. Pt currently with functional limitations due to the deficits listed below (see PT Problem List). Ambulates generally well with a rolling walker for support, apparently near her baseline with mobility. No physical assist required for transfers or bed mobility. Spoke at length with patient's daughter who plans to arrange for 24 hour supervision of patient temporarily due to fall at home. Pt will benefit from skilled PT to increase their independence and safety with mobility to allow discharge to the venue listed below.       Follow Up Recommendations Home health PT;Supervision/Assistance - 24 hour    Equipment Recommendations  None recommended by PT    Recommendations for Other Services       Precautions / Restrictions Precautions Precautions: Fall Restrictions Weight Bearing Restrictions: No      Mobility  Bed Mobility Overal bed mobility: Needs Assistance Bed Mobility: Sit to Supine       Sit to supine: Supervision   General bed mobility comments: Supervision for safety. VC for technique. No physical assist needed  Transfers Overall transfer level: Needs assistance Equipment used: Rolling walker (2 wheeled) Transfers: Sit to/from Stand Sit to Stand: Min guard         General transfer comment: Min guard for safety from reclining chair. VC for hand placement.   Ambulation/Gait Ambulation/Gait assistance: Supervision Ambulation Distance (Feet): 150 Feet Assistive device: Rolling walker (2 wheeled) Gait Pattern/deviations:  Step-through pattern;Decreased stride length;Trunk flexed   Gait velocity interpretation: at or above normal speed for age/gender General Gait Details: VC for upright posture and instructions for walker proximity with gait. No loss of balance noted. Did not require any rest breaks to complete distance. Denies any lightheadedness or dizziness during bout. States she feels "a little off balance"  Stairs            Wheelchair Mobility    Modified Rankin (Stroke Patients Only)       Balance Overall balance assessment: Needs assistance;History of Falls Sitting-balance support: No upper extremity supported;Feet supported Sitting balance-Leahy Scale: Good     Standing balance support: No upper extremity supported;During functional activity Standing balance-Leahy Scale: Fair                               Pertinent Vitals/Pain Pain Assessment: No/denies pain    Home Living Family/patient expects to be discharged to:: Private residence Living Arrangements: Alone Available Help at Discharge: Family;Available PRN/intermittently (Daughter available at night) Type of Home: House Home Access: Stairs to enter Entrance Stairs-Rails: None Entrance Stairs-Number of Steps: 1 Home Layout: One level Home Equipment: Walker - 2 wheels;Shower seat;Grab bars - toilet;Toilet riser      Prior Function Level of Independence: Independent with assistive device(s)         Comments: RW for mobility     Hand Dominance   Dominant Hand: Right    Extremity/Trunk Assessment   Upper Extremity Assessment: Defer to OT evaluation           Lower Extremity Assessment: Overall WFL for tasks assessed  Communication   Communication: No difficulties  Cognition Arousal/Alertness: Awake/alert Behavior During Therapy: WFL for tasks assessed/performed Overall Cognitive Status: Within Functional Limits for tasks assessed                      General Comments  General comments (skin integrity, edema, etc.): Daughter was present during evaluation and participated in discussion with PT and patient regarding safe d/c planning and needs. Daughter states she plans to work out 24 hour supervision.    Exercises General Exercises - Lower Extremity Ankle Circles/Pumps: AROM;Both;10 reps;Seated      Assessment/Plan    PT Assessment Patient needs continued PT services  PT Diagnosis Abnormality of gait   PT Problem List Decreased strength;Decreased balance;Decreased activity tolerance;Decreased mobility;Decreased knowledge of use of DME  PT Treatment Interventions DME instruction;Gait training;Functional mobility training;Therapeutic activities;Therapeutic exercise;Neuromuscular re-education;Stair training;Balance training;Patient/family education;Modalities   PT Goals (Current goals can be found in the Care Plan section) Acute Rehab PT Goals Patient Stated Goal: Return home PT Goal Formulation: With patient/family Time For Goal Achievement: 06/12/14 Potential to Achieve Goals: Good    Frequency Min 3X/week   Barriers to discharge Decreased caregiver support lives alone    Co-evaluation               End of Session Equipment Utilized During Treatment: Gait belt Activity Tolerance: Patient tolerated treatment well Patient left: in bed;with call bell/phone within reach;with family/visitor present Nurse Communication: Mobility status         Time: 1023-1056 PT Time Calculation (min) (ACUTE ONLY): 33 min   Charges:   PT Evaluation $Initial PT Evaluation Tier I: 1 Procedure PT Treatments $Gait Training: 8-22 mins   PT G CodesBerton Mount 05/29/2014, 1:21 PM  [name] [name], [name] [name] [phone]

## 2014-05-29 NOTE — Progress Notes (Signed)
INITIAL NUTRITION ASSESSMENT  DOCUMENTATION CODES Per approved criteria  -Not Applicable   INTERVENTION: 1.  Supplements; Ensure Enlive po daily, each supplement provides 350 kcal and 20 grams of protein  NUTRITION DIAGNOSIS: Inadequate oral intake related to poor appetite  as evidenced by pt and family report.   Monitor:  1.  Food/Beverage; pt meeting >/=90% estimated needs with tolerance. 2.  Wt/wt change; monitor trends  Reason for Assessment: consult  81 y.o. female  Admitting Dx: Atrial fibrillation  ASSESSMENT: Patient admitted after unwitnessed fall at home.  Patient recently started Remeron for poor appetite and fell the same day.    Patient has experienced progressive weight loss over the past 3-4 years from 130 lbs to now 100 lbs. She has lost 5-7 lbs over the past 1 year.   Note Remeron is currently on hold.  Question whether checking orthostatics if Remeron is restarted could be useful.  MD also investigating a fib vs. Sedative affects as cause of fall.   RD met with patient who states that she feels her appetite is fine and that she gets hungry with meals.  Patients states she lives at home alone and prepares her own meals.  She reports she has lost "a few pounds" this year and doesn't know why.  She alludes to the fact that she may skip a day without eating, but is somewhat vague on how often this occurs.  She states she is drinking 1 Ensure every other day.  Could be helpful to discuss with daughter and confirm intake. Patient would likely benefit from increasing supplementation to at least 1 Ensure every day over risks of side effects of Remeron. She states she is eating about 50% of her meals in the hospital.  She reports she likes Ensure and would drink it more if she could afford it.    Height: Ht Readings from Last 1 Encounters:  05/29/14 '5\' 1"'  (1.549 m)    Weight: Wt Readings from Last 1 Encounters:  05/29/14 100 lb 12 oz (45.7 kg)    Ideal Body Weight:  105 lbs  % Ideal Body Weight: 97%  Wt Readings from Last 10 Encounters:  05/29/14 100 lb 12 oz (45.7 kg)  05/27/14 102 lb (46.267 kg)  12/01/13 108 lb (48.988 kg)  11/25/13 108 lb 2 oz (49.045 kg)  05/27/13 106 lb 2 oz (48.138 kg)  01/02/13 111 lb (50.349 kg)  12/01/12 113 lb (51.256 kg)  04/29/12 111 lb (50.349 kg)  04/24/12 110 lb 6.4 oz (50.077 kg)  04/17/12 112 lb (50.803 kg)    Usual Body Weight: 108-110 lbs over the past 1-2 years.   % Usual Body Weight: 92%  BMI:  Body mass index is 19.05 kg/(m^2).  Estimated Nutritional Needs: Kcal: 1200-1400 Protein: 45-55g Fluid: >1.8 L/day  Skin: intact  Diet Order: Diet Heart  EDUCATION NEEDS: -Education needs addressed   Intake/Output Summary (Last 24 hours) at 05/29/14 1336 Last data filed at 05/29/14 0900  Gross per 24 hour  Intake    120 ml  Output    300 ml  Net   -180 ml    Last BM: 3/19   Labs:   Recent Labs Lab 05/28/14 1955 05/29/14 0351  NA 138 137  K 3.4* 3.4*  CL 103 105  CO2 26 26  BUN 13 16  CREATININE 0.68 0.74  CALCIUM 8.6 8.2*  GLUCOSE 112* 104*    CBG (last 3)  No results for input(s): GLUCAP in the last 72  hours.  Scheduled Meds: . digoxin  0.125 mg Oral Daily  . digoxin  0.25 mg Oral Once  . diltiazem  240 mg Oral Daily  . folic acid  1 mg Oral Daily  . levothyroxine  75 mcg Oral QAC breakfast  . predniSONE  5 mg Oral Q breakfast  . sodium chloride  3 mL Intravenous Q12H  . warfarin  5 mg Oral ONCE-1800  . Warfarin - Pharmacist Dosing Inpatient   Does not apply q1800    Continuous Infusions: . sodium chloride 75 mL/hr at 05/29/14 0056    Past Medical History  Diagnosis Date  . Unspecified cerebral artery occlusion with cerebral infarction   . Recurrent cold sores   . Xanthelasma of eyelid(374.51)   . Ganglion cyst   . Anatomical narrow angle borderline glaucoma   . Hypothyroidism   . Hypertension   . Atrial fibrillation   . Sjogren's syndrome   . Goiter   .  Rheumatoid arthritis(714.0)   . Osteoporosis   . Impaired glucose tolerance 11/25/2013    Past Surgical History  Procedure Laterality Date  . Appendectomy    . Vaginal cyst removed  [place], MS RD LDN Clinical Inpatient Dietitian Weekend/After hours pager: [phone]

## 2014-05-29 NOTE — ED Notes (Signed)
Flow called to inquire about bed request.  

## 2014-05-30 DIAGNOSIS — S3210XD Unspecified fracture of sacrum, subsequent encounter for fracture with routine healing: Secondary | ICD-10-CM

## 2014-05-30 LAB — CBC
HCT: 37.3 % (ref 36.0–46.0)
HEMOGLOBIN: 11.9 g/dL — AB (ref 12.0–15.0)
MCH: 29.7 pg (ref 26.0–34.0)
MCHC: 31.9 g/dL (ref 30.0–36.0)
MCV: 93 fL (ref 78.0–100.0)
Platelets: 310 10*3/uL (ref 150–400)
RBC: 4.01 MIL/uL (ref 3.87–5.11)
RDW: 15 % (ref 11.5–15.5)
WBC: 11.3 10*3/uL — ABNORMAL HIGH (ref 4.0–10.5)

## 2014-05-30 LAB — PROTIME-INR
INR: 1.69 — ABNORMAL HIGH (ref 0.00–1.49)
Prothrombin Time: 20.1 seconds — ABNORMAL HIGH (ref 11.6–15.2)

## 2014-05-30 LAB — URINE CULTURE: Colony Count: 70000

## 2014-05-30 MED ORDER — WARFARIN SODIUM 5 MG PO TABS
5.0000 mg | ORAL_TABLET | Freq: Once | ORAL | Status: DC
  Filled 2014-05-30: qty 1

## 2014-05-30 MED ORDER — ENSURE ENLIVE PO LIQD: 237.0000 mL | Freq: Every day | ORAL | Status: AC

## 2014-05-30 NOTE — Progress Notes (Signed)
ANTICOAGULATION CONSULT NOTE - Follow Up Consult  Pharmacy Consult for Coumadin Indication: atrial fibrillation  Allergies  Allergen Reactions  . Hydroxychloroquine Sulfate Itching and Rash    Patient Measurements: Height: 5\' 1"  (154.9 cm) Weight: 100 lb 12 oz (45.7 kg) IBW/kg (Calculated) : 47.8  Vital Signs: Temp: 97.9 F (36.6 C) (03/20 0530) Temp Source: Oral (03/20 0530) BP: 130/57 mmHg (03/20 1000) Pulse Rate: 70 (03/20 1000)  Labs:  Recent Labs  05/28/14 1700 05/28/14 1955 05/29/14 0351 05/30/14 0445  HGB 15.1*  --  13.2 11.9*  HCT 45.0  --  41.5 37.3  PLT 393  --  371 310  LABPROT 19.9*  --  20.3* 20.1*  INR 1.67*  --  1.72* 1.69*  CREATININE  --  0.68 0.74  --   CKTOTAL  --  105  --   --     Estimated Creatinine Clearance: 31 mL/min (by C-G formula based on Cr of 0.74).  Assessment: 89yof on coumadin pta for afib, admitted 3/18 s/p fall in her bathroom. INR on admission was 1.67 and coumadin placed on hold. CT head unremarkable and CBC remained stabled so coumadin resumed 3/19. INR today remains subtherapeutic at 1.69.  Home dose: 2.5mg  daily except 5mg  on Wednesday  Goal of Therapy:  INR 2-3 Monitor platelets by anticoagulation protocol: Yes   Plan:  1) Repeat coumadin 5mg  x 1 2) INR in AM  05/30/2014,11:26 AM

## 2014-05-30 NOTE — Progress Notes (Signed)
UR Completed.  [phone]  

## 2014-05-30 NOTE — Progress Notes (Signed)
Pt D/cd home. Alert and oriented x4.  No c/o pain.  Education given on diet, activity, meds, and follow-up care and instructions.  Pt verbalized understanding.  Pt will go home with daughter and [facility] through [facility].  IV D/Cd Tele D/Cd

## 2014-05-31 ENCOUNTER — Telehealth: Payer: Self-pay | Admitting: Internal Medicine

## 2014-05-31 NOTE — Telephone Encounter (Signed)
Ok for verbal 

## 2014-05-31 NOTE — Telephone Encounter (Signed)
[name] [name] [phone] [facility]   Needing Verbal for OT

## 2014-05-31 NOTE — Telephone Encounter (Signed)
[name] [name] called again and requested an order for patient for nursing to help her with medications. [phone]

## 2014-06-01 ENCOUNTER — Ambulatory Visit (INDEPENDENT_AMBULATORY_CARE_PROVIDER_SITE_OTHER): Payer: Medicare Other | Admitting: General Practice

## 2014-06-01 DIAGNOSIS — Z5181 Encounter for therapeutic drug level monitoring: Secondary | ICD-10-CM

## 2014-06-01 LAB — POCT INR: INR: 2.2

## 2014-06-01 NOTE — Progress Notes (Signed)
Pre visit review using our clinic review tool, if applicable. No additional management support is needed unless otherwise documented below in the visit note. 

## 2014-06-01 NOTE — Progress Notes (Signed)
Agree with plan 

## 2014-06-01 NOTE — Telephone Encounter (Signed)
Called [name] [name] no answer LMOM with md response...[name] [name]

## 2014-06-08 ENCOUNTER — Ambulatory Visit: Payer: Medicare Other | Admitting: General Practice

## 2014-06-08 DIAGNOSIS — Z5181 Encounter for therapeutic drug level monitoring: Secondary | ICD-10-CM

## 2014-06-08 LAB — POCT INR: INR: 2.6

## 2014-06-08 NOTE — Progress Notes (Signed)
Pre visit review using our clinic review tool, if applicable. No additional management support is needed unless otherwise documented below in the visit note. 

## 2014-06-08 NOTE — Progress Notes (Signed)
Agree with plan 

## 2014-06-09 DIAGNOSIS — S3219XD Other fracture of sacrum, subsequent encounter for fracture with routine healing: Secondary | ICD-10-CM | POA: Diagnosis not present

## 2014-06-10 ENCOUNTER — Inpatient Hospital Stay: Payer: Medicare Other | Admitting: Internal Medicine

## 2014-06-15 ENCOUNTER — Ambulatory Visit (INDEPENDENT_AMBULATORY_CARE_PROVIDER_SITE_OTHER): Payer: Medicare Other | Admitting: General Practice

## 2014-06-15 DIAGNOSIS — Z5181 Encounter for therapeutic drug level monitoring: Secondary | ICD-10-CM

## 2014-06-15 DIAGNOSIS — I4891 Unspecified atrial fibrillation: Secondary | ICD-10-CM

## 2014-06-15 LAB — POCT INR: INR: 3.2

## 2014-06-15 NOTE — Progress Notes (Signed)
Pre visit review using our clinic review tool, if applicable. No additional management support is needed unless otherwise documented below in the visit note. 

## 2014-06-15 NOTE — Progress Notes (Signed)
Agree with plan 

## 2014-06-17 ENCOUNTER — Ambulatory Visit (INDEPENDENT_AMBULATORY_CARE_PROVIDER_SITE_OTHER): Payer: Medicare Other | Admitting: Internal Medicine

## 2014-06-17 ENCOUNTER — Encounter: Payer: Self-pay | Admitting: Internal Medicine

## 2014-06-17 VITALS — BP 138/80 | HR 83 | Temp 97.5°F | Resp 18 | Ht 61.0 in | Wt 101.0 lb

## 2014-06-17 DIAGNOSIS — I5022 Chronic systolic (congestive) heart failure: Secondary | ICD-10-CM | POA: Diagnosis not present

## 2014-06-17 DIAGNOSIS — I10 Essential (primary) hypertension: Secondary | ICD-10-CM

## 2014-06-17 DIAGNOSIS — R55 Syncope and collapse: Secondary | ICD-10-CM

## 2014-06-17 DIAGNOSIS — I4891 Unspecified atrial fibrillation: Secondary | ICD-10-CM | POA: Diagnosis not present

## 2014-06-17 NOTE — Progress Notes (Signed)
Subjective:    Patient ID: [name] [name], female    DOB: [birth year], 81 y.o.   MRN: [id number]  HPI  Here for hosp f/u.  No D/C summary available at this time. Pt denies chest pain, increased sob or doe, wheezing, orthopnea, PND, increased LE swelling, palpitations, dizziness or syncope post d/c.  Pt denies new neurological symptoms such as new headache, or facial or extremity weakness or numbness   Pt denies polydipsia, polyuria.  Denies hyper or hypo thyroid symptoms such as voice, skin or hair change. Pain overall tolerable.  Scalp hematoma nearly resolved. Past Medical History  Diagnosis Date  . Unspecified cerebral artery occlusion with cerebral infarction   . Recurrent cold sores   . Xanthelasma of eyelid(374.51)   . Ganglion cyst   . Anatomical narrow angle borderline glaucoma   . Hypothyroidism   . Hypertension   . Atrial fibrillation   . Sjogren's syndrome   . Goiter   . Rheumatoid arthritis(714.0)   . Osteoporosis   . Impaired glucose tolerance 11/25/2013   Past Surgical History  Procedure Laterality Date  . Appendectomy    . Vaginal cyst removed  1998     reports that she has never smoked. She does not have any smokeless tobacco history on file. She reports that she does not drink alcohol or use illicit drugs. family history is not on file. Allergies  Allergen Reactions  . Hydroxychloroquine Sulfate Itching and Rash   Current Outpatient Prescriptions on File Prior to Visit  Medication Sig Dispense Refill  . Cholecalciferol (VITAMIN D3) 2000 UNITS TABS Take 2,000 Units by mouth daily.    . digoxin (DIGOX) 0.125 MG tablet Take 1 tablet (0.125 mg total) by mouth daily. 90 tablet 3  . diltiazem (CARDIZEM CD) 240 MG 24 hr capsule Take 1 capsule (240 mg total) by mouth daily. 90 capsule 3  . feeding supplement, ENSURE ENLIVE, (ENSURE ENLIVE) LIQD Take 237 mLs by mouth daily at 3 pm. 30 Can 12  . folic acid (FOLVITE) 1 MG tablet Take 1 tablet (1 mg total) by mouth daily.  90 tablet 3  . hydrochlorothiazide (HYDRODIURIL) 25 MG tablet Take 0.5 tablets (12.5 mg total) by mouth daily. 30 tablet 11  . levothyroxine (SYNTHROID, LEVOTHROID) 75 MCG tablet Take 1 tablet (75 mcg total) by mouth daily. 90 tablet 3  . methotrexate 2.5 MG tablet Take 20 mg by mouth once a week. Takes on Wednesday    . predniSONE (DELTASONE) 5 MG tablet Take 5 mg by mouth daily.      Marland Kitchen warfarin (COUMADIN) 5 MG tablet Take as directed by anticoagulation clinic (Patient taking differently: Take 2.5-5 mg by mouth daily at 6 PM. Takes 5mg  on Wednesday  Takes 2.5mg  all other days) 30 tablet 3  . mirtazapine (REMERON) 15 MG tablet Take 1 tablet (15 mg total) by mouth at bedtime. (Patient not taking: Reported on 06/17/2014) 30 tablet 11   No current facility-administered medications on file prior to visit.   Review of Systems  Constitutional: Negative for unusual diaphoresis or night sweats HENT: Negative for ringing in ear or discharge Eyes: Negative for double vision or worsening visual disturbance.  Respiratory: Negative for choking and stridor.   Gastrointestinal: Negative for vomiting or other signifcant bowel change Genitourinary: Negative for hematuria or change in urine volume.  Musculoskeletal: Negative for other MSK pain or swelling Skin: Negative for color change and worsening wound.  Neurological: Negative for tremors and numbness other than noted  Psychiatric/Behavioral: Negative for decreased concentration or agitation other than above       Objective:   Physical Exam BP 138/80 mmHg  Pulse 83  Temp(Src) 97.5 F (36.4 C) (Oral)  Resp 18  Ht 5\' 1"  (1.549 m)  Wt 101 lb (45.813 kg)  BMI 19.09 kg/m2  SpO2 95% VS noted,  Constitutional: Pt appears in no significant distress HENT: Head: NCAT.  Right Ear: External ear normal.  Left Ear: External ear normal.  Eyes: . Pupils are equal, round, and reactive to light. Conjunctivae and EOM are normal Neck: Normal range of motion.  Neck supple.  Cardiovascular: Normal rate and irregular rhythm.   Pulmonary/Chest: Effort normal and breath sounds without rales or wheezing.  Abd:  Soft, NT, ND, + BS Neurological: Pt is alert. Not confused , motor grossly intact Skin: Skin is warm. No rash, no LE edema Psychiatric: Pt behavior is normal. No agitation.     Assessment & Plan:

## 2014-06-17 NOTE — Patient Instructions (Addendum)
Please continue all other medications as before, and refills have been done if requested.  Please have the pharmacy call with any other refills you may need.  Please keep your appointments with your specialists as you may have planned  Please return in 3 months, or sooner if needed 

## 2014-06-17 NOTE — Progress Notes (Signed)
Pre visit review using our clinic review tool, if applicable. No additional management support is needed unless otherwise documented below in the visit note. 

## 2014-06-19 NOTE — Assessment & Plan Note (Signed)
Rate improved, volume stable,  to f/u any worsening symptoms or concerns

## 2014-06-19 NOTE — Assessment & Plan Note (Signed)
stable overall by history and exam, recent data reviewed with pt, and pt to continue medical treatment as before,  to f/u any worsening symptoms or concerns Lab Results  Component Value Date   WBC 11.3* 05/30/2014   HGB 11.9* 05/30/2014   HCT 37.3 05/30/2014   PLT 310 05/30/2014   GLUCOSE 104* 05/29/2014   CHOL 200 05/27/2013   TRIG 116.0 05/27/2013   HDL 68.70 05/27/2013   LDLCALC 108* 05/27/2013   ALT 9 05/29/2014   AST 23 05/29/2014   NA 137 05/29/2014   K 3.4* 05/29/2014   CL 105 05/29/2014   CREATININE 0.74 05/29/2014   BUN 16 05/29/2014   CO2 26 05/29/2014   TSH 1.31 05/27/2013   INR 3.2 06/15/2014   HGBA1C * 12/15/2009    6.4 (NOTE)                                                                       According to the ADA Clinical Practice Recommendations for 2011, when HbA1c is used as a screening test:   >=6.5%   Diagnostic of Diabetes Mellitus           (if abnormal result  is confirmed)  5.7-6.4%   Increased risk of developing Diabetes Mellitus  References:Diagnosis and Classification of Diabetes Mellitus,Diabetes Care,2011,34(Suppl 1):S62-S69 and Standards of Medical Care in         Diabetes - 2011,Diabetes Care,2011,34  (Suppl 1):S11-S61.

## 2014-06-19 NOTE — Assessment & Plan Note (Signed)
stable overall by history and exam, recent data reviewed with pt, and pt to continue medical treatment as before,  to f/u any worsening symptoms or concerns BP Readings from Last 3 Encounters:  06/17/14 138/80  05/30/14 142/47  05/27/14 126/68

## 2014-06-19 NOTE — Assessment & Plan Note (Signed)
Compensated, stable overall by history and exam, and pt to continue medical treatment as before,  to f/u any worsening symptoms or concerns

## 2014-06-22 ENCOUNTER — Ambulatory Visit: Payer: Medicare Other | Admitting: General Practice

## 2014-06-22 DIAGNOSIS — Z5181 Encounter for therapeutic drug level monitoring: Secondary | ICD-10-CM

## 2014-06-22 LAB — POCT INR: INR: 2.1

## 2014-06-22 NOTE — Progress Notes (Signed)
Agree with plan 

## 2014-06-22 NOTE — Progress Notes (Signed)
Pre visit review using our clinic review tool, if applicable. No additional management support is needed unless otherwise documented below in the visit note. 

## 2014-06-25 ENCOUNTER — Telehealth: Payer: Self-pay | Admitting: Internal Medicine

## 2014-06-25 DIAGNOSIS — M069 Rheumatoid arthritis, unspecified: Secondary | ICD-10-CM

## 2014-06-25 DIAGNOSIS — Z8673 Personal history of transient ischemic attack (TIA), and cerebral infarction without residual deficits: Secondary | ICD-10-CM

## 2014-06-25 DIAGNOSIS — I4891 Unspecified atrial fibrillation: Secondary | ICD-10-CM

## 2014-06-25 DIAGNOSIS — I5022 Chronic systolic (congestive) heart failure: Secondary | ICD-10-CM

## 2014-06-25 NOTE — Telephone Encounter (Signed)
Dr. Jonny Ruiz had suggested assistant living for her. Her daughter is trying to pursue that and she was hoping you could give her a call when you get a chance to help her understand and there is an FS1(?) form that she is wondering about also.  Early next week is fine.

## 2014-06-29 ENCOUNTER — Ambulatory Visit: Payer: Medicare Other | Admitting: General Practice

## 2014-06-29 DIAGNOSIS — Z5181 Encounter for therapeutic drug level monitoring: Secondary | ICD-10-CM

## 2014-06-29 LAB — POCT INR: INR: 3.5

## 2014-06-29 NOTE — Progress Notes (Signed)
Pre visit review using our clinic review tool, if applicable. No additional management support is needed unless otherwise documented below in the visit note. 

## 2014-06-29 NOTE — Telephone Encounter (Signed)
To clarify, the FL2 is being faxed to me today.  Dr. Jonny Ruiz,  The patients daughter is seeking a DNR to be signed. What process do we need to follow to have that document placed into the system?

## 2014-06-29 NOTE — Telephone Encounter (Signed)
[name] [name] is looking for further OT orders and Home Health STAT. Patient needs today.

## 2014-06-29 NOTE — Progress Notes (Signed)
Agree with plan 

## 2014-06-30 ENCOUNTER — Telehealth: Payer: Self-pay | Admitting: Internal Medicine

## 2014-06-30 NOTE — Telephone Encounter (Signed)
Lawson Fiscal from [facility] [phone]   Need verbal orders She is has a lot of question   1 week 1 2 week 2

## 2014-06-30 NOTE — Telephone Encounter (Signed)
I think [name] [name] has the "out of hospital DNR " forms or knows where to get one  OK for verbal for OT and home health, I will put in order as well

## 2014-07-02 NOTE — Telephone Encounter (Signed)
First time I have understood this , given previous noted  OK for verbal

## 2014-07-02 NOTE — Telephone Encounter (Signed)
[name] call back she stated that she is trying to get verbal orders to continue PT & OT to ensure safety in the home for 2 more weeks 2 times a week. Nurse has been calling since Tues she stated no one has return call. Is this ok...Raechel Chute

## 2014-07-02 NOTE — Telephone Encounter (Signed)
Called [name] back no answer LMOM with md response...Raechel Chute

## 2014-07-02 NOTE — Telephone Encounter (Signed)
Called [name] back to clarify msg below no answer LMOM RTC...[name] [name]

## 2014-07-06 ENCOUNTER — Telehealth: Payer: Self-pay | Admitting: General Practice

## 2014-07-06 ENCOUNTER — Telehealth: Payer: Self-pay | Admitting: Internal Medicine

## 2014-07-06 ENCOUNTER — Other Ambulatory Visit: Payer: Self-pay | Admitting: Internal Medicine

## 2014-07-06 ENCOUNTER — Ambulatory Visit: Payer: Medicare Other | Admitting: General Practice

## 2014-07-06 DIAGNOSIS — Z5181 Encounter for therapeutic drug level monitoring: Secondary | ICD-10-CM

## 2014-07-06 LAB — POCT INR: INR: 1.7

## 2014-07-06 NOTE — Progress Notes (Signed)
Pre visit review using our clinic review tool, if applicable. No additional management support is needed unless otherwise documented below in the visit note. 

## 2014-07-06 NOTE — Progress Notes (Signed)
Agree with plan 

## 2014-07-06 NOTE — Telephone Encounter (Signed)
Spoke with daughter and gave dosing instructions.  Dtr verbalized understanding.

## 2014-07-06 NOTE — Telephone Encounter (Signed)
[name] [name] with [facility] reporting PTINR  Inr -1.7 Pt- 20.3   Last visit today and  She think that pt missed a day of her meds   [phone] - Cell number [name] [name]  [phone]- [name] [name] pt Daughter

## 2014-07-07 IMAGING — CR DG LUMBAR SPINE COMPLETE 4+V
5 series · 5 of 5 positions shown · non-contrast
Comparison: Lumbar spine MRI - 08/18/2011; lumbar spine radiographs
- 08/15/2011

CLINICAL DATA: Post fall, now with back pain

LUMBAR SPINE - COMPLETE 4+ VIEW

[t l-spine a.p.]
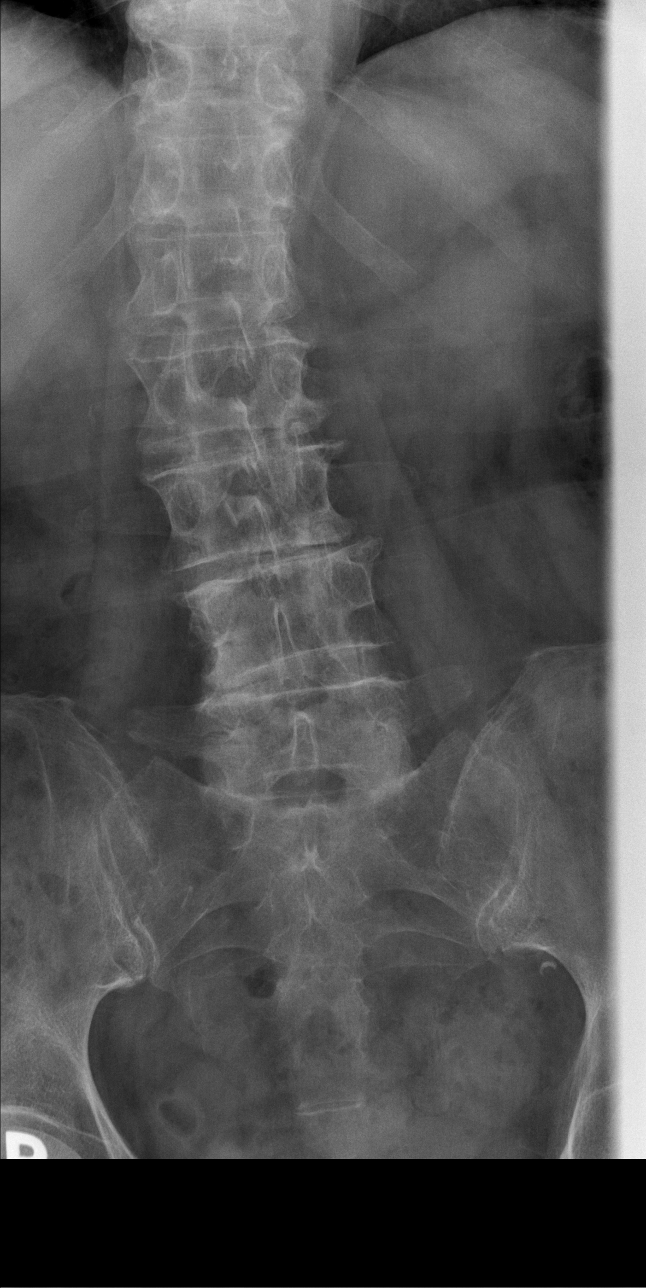

[t l-spine oblique exposure (1 of 2)]
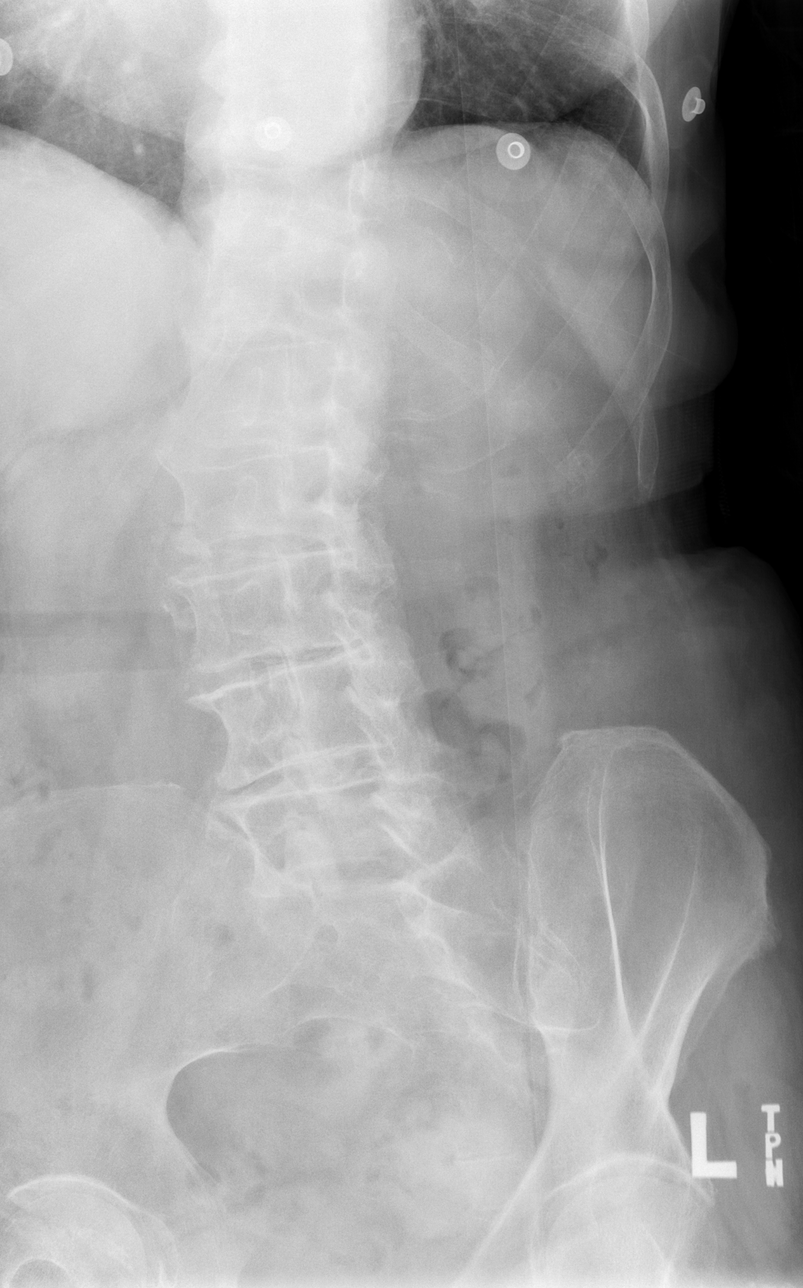

[t l-spine oblique exposure (2 of 2)]
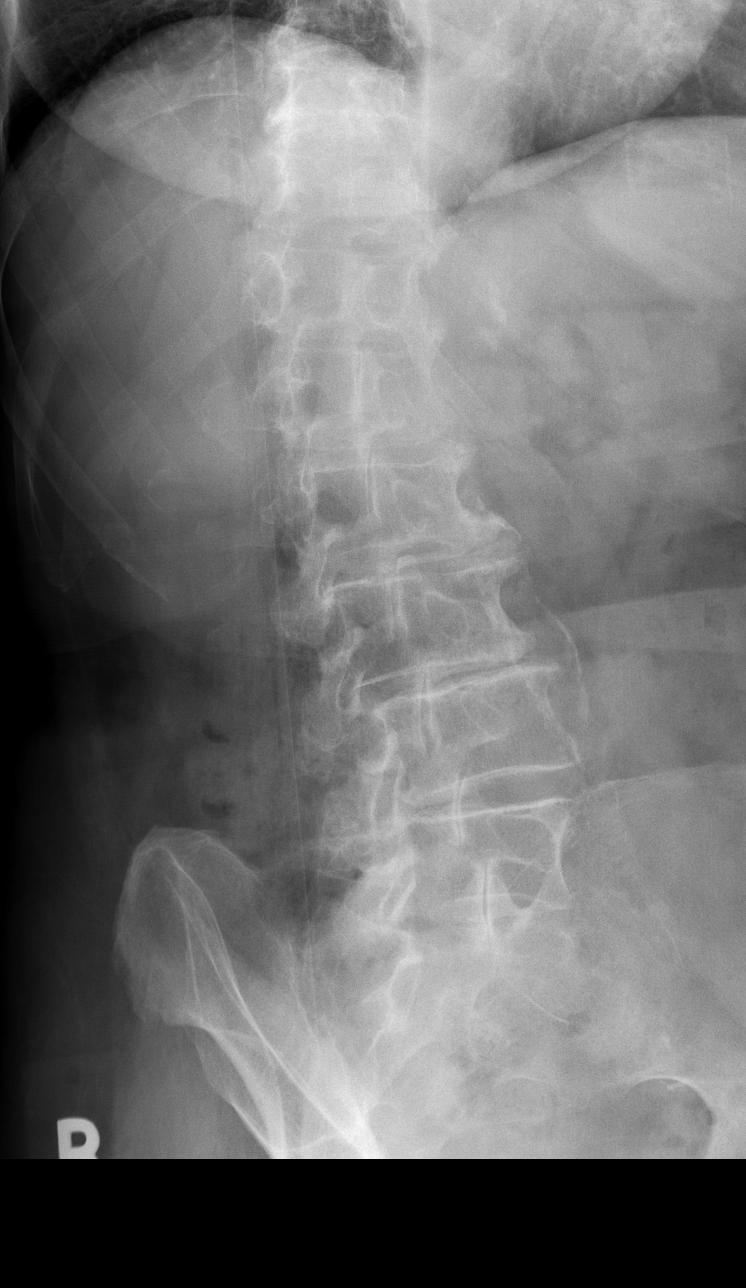

[t l-spine lat]
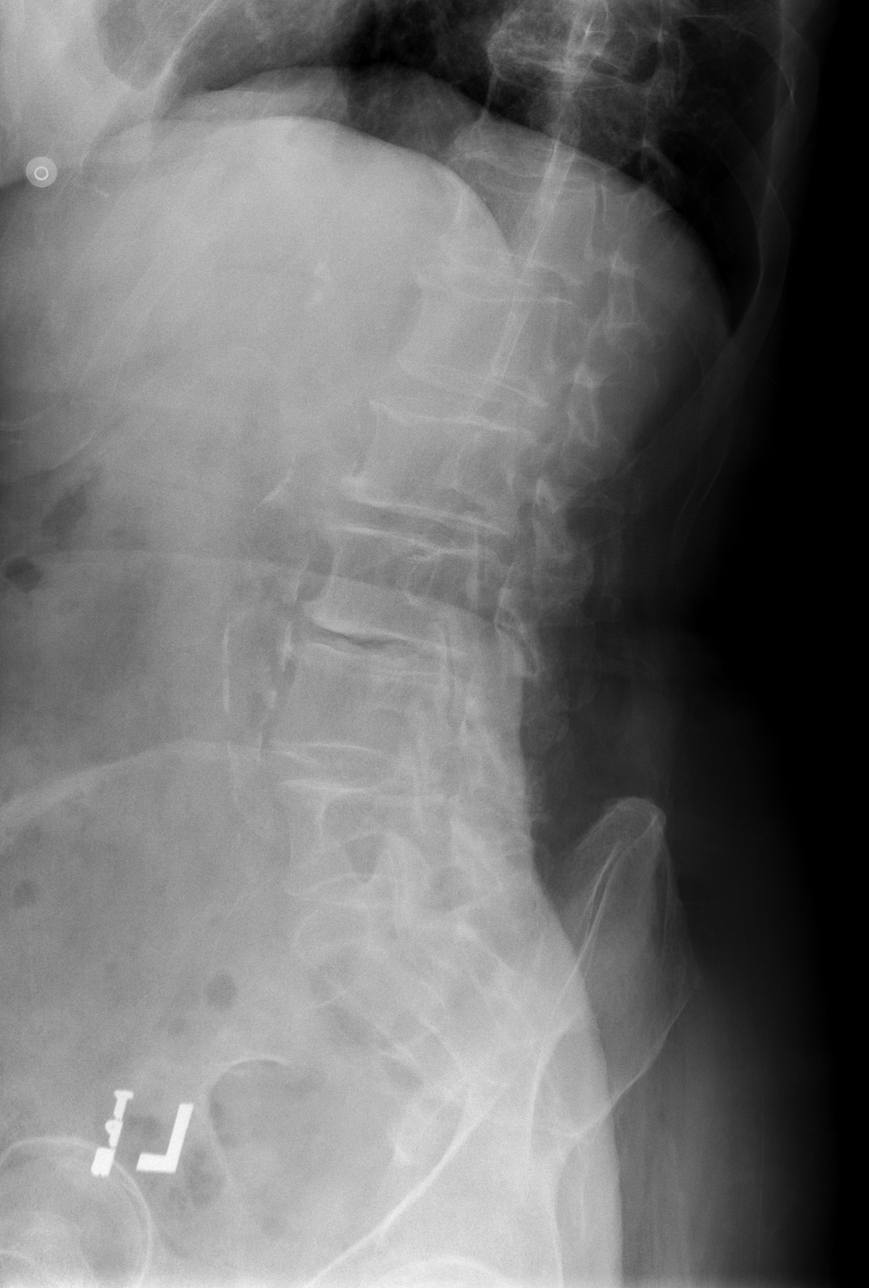

[t l-spine l5-s1 spot]
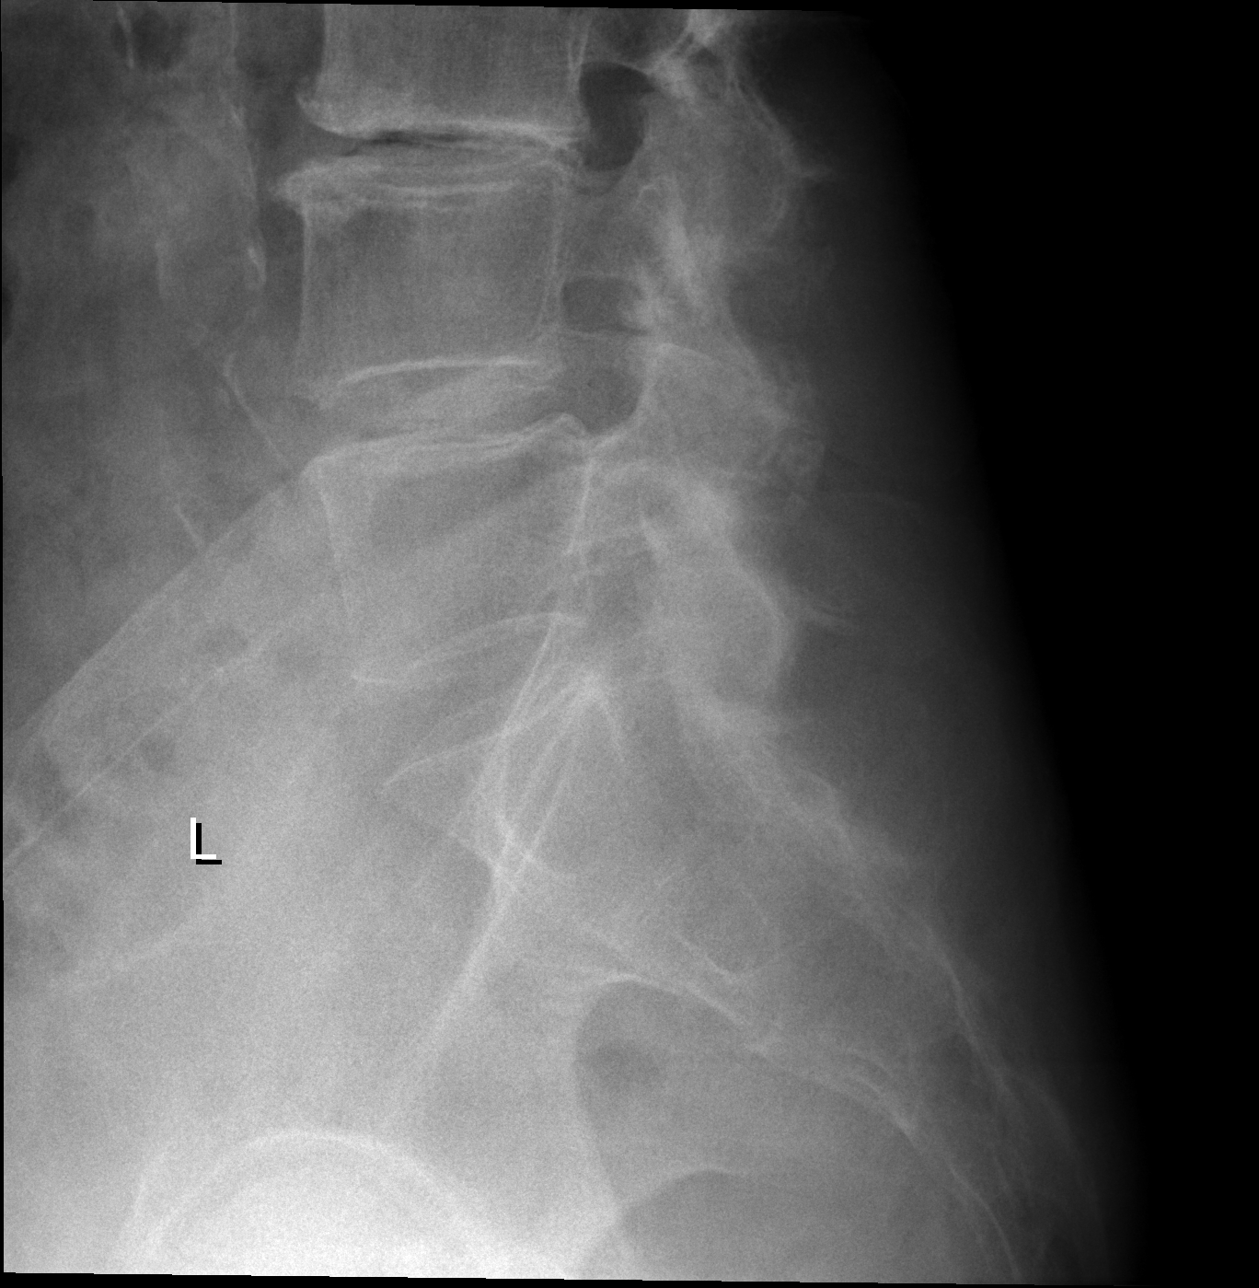

[5 of 5 positions shown; findings below may reference images not displayed]

FINDINGS: There are five non-rib bearing lumbar type vertebral bodies.

Mild scoliotic curvature of the thoracolumbar spine, convex to the
right.

The provided lateral radiograph is degraded secondary to obliquity.
No definite anterolisthesis or retrolisthesis.  No definite pars
defects.

There is grossly unchanged mild cavity of the superior endplate of
the L1 and L2 vertebral bodies.  Remaining intervertebral body
heights are preserved.  Redemonstrated mild to moderate multilevel
DDD, worst at L2 - L3 and L3 - L4.

Vascular calcifications within the abdominal aorta.  Regional bowel
gas pattern and soft tissues are normal.
IMPRESSION: Grossly unchanged mild to moderate multilevel DDD without definite
acute findings.

## 2014-07-09 NOTE — Discharge Summary (Signed)
Discharge Summary  [name] [name] ZOX:[id number] DOB: 01-08-1919  PCP: Oliver Barre, MD  Admit date: 05/28/2014 Discharge date: 05/30/2014  Time spent: 25 minutes  Recommendations for Outpatient Follow-up:  1. Medication change: Folate being discontinued as she is already on multivitamin 2. Patient being discharged with home health PT 3. Patient will have her INR rechecked on 3/22  Discharge Diagnoses:  Active Hospital Problems   Diagnosis Date Noted  . Vasovagal syncope 05/29/2014  . Sacral fracture 05/29/2014  . Scalp hematoma 05/29/2014  . Chronic systolic heart failure 05/29/2014  . Long term current use of anticoagulant therapy 05/30/2010  . Atrial fibrillation 04/14/2007  . Rheumatoid arthritis 04/14/2007  . Essential hypertension 04/14/2007  . Hypothyroidism 04/14/2007    Resolved Hospital Problems   Diagnosis Date Noted Date Resolved  No resolved problems to display.    Discharge Condition: Discharged home with home health  Diet recommendation: Heart healthy  Filed Weights   05/29/14 0041 05/29/14 0502  Weight: 45.723 kg (100 lb 12.8 oz) 45.7 kg (100 lb 12 oz)    History of present illness:  81 year old female with atrial fibrillation on chronic Coumadin and chronic systolic heart failure, mostly ambulatory with use of a walker unclear on events, but without warning she passed out while she was in the bathroom. Found later with bruising around left side of head and taken to emergency room. Patient awake and alert. No evidence of seizure activity. Found to have small sacral fracture. Admitted on 3/18  Hospital Course:  Principal Problem:   Vasovagal syncope Active Problems:   Hypothyroidism: Stable on Synthroid   Essential hypertension: Stable, she was continued on her home medications.   Atrial fibrillation on chronic anticoagulation: Rate controlled. Her digoxin level checked on admission noted to be subtherapeutic so patient received additional loading dose  during hospitalization. Patient's INR was subtherapeutic. No evidence of active bleeding. Coumadin initially held because of scalp hematoma, restarted and managed by pharmacy. Still subtherapeutic by day of discharge, but expected to increase over the next few days.    Rheumatoid arthritis: Stable   Long term current use of anticoagulant therapy   Sacral fracture: Non-op type of fracture. Physical therapy recommending home health PT.   Scalp hematoma: Stable, no evidence of active bleeding.   Chronic systolic heart failure: No signs of volume overload. BNP checked and found to be normal at 176   Procedures:  None  Consultations:  None  Discharge Exam: BP 142/47 mmHg  Pulse 50  Temp(Src) 98.2 F (36.8 C) (Oral)  Resp 17  Ht 5\' 1"  (1.549 m)  Wt 45.7 kg (100 lb 12 oz)  BMI 19.05 kg/m2  SpO2 95%  General: Alert and oriented 3, no acute distress Cardiovascular: Irregular rhythm, rate controlled Respiratory: Clear to auscultation bilaterally  Discharge Instructions You were cared for by a hospitalist during your hospital stay. If you have any questions about your discharge medications or the care you received while you were in the hospital after you are discharged, you can call the unit and asked to speak with the hospitalist on call if the hospitalist that took care of you is not available. Once you are discharged, your primary care physician will handle any further medical issues. Please note that NO REFILLS for any discharge medications will be authorized once you are discharged, as it is imperative that you return to your primary care physician (or establish a relationship with a primary care physician if you do not have one) for your aftercare  needs so that they can reassess your need for medications and monitor your lab values.  Discharge Instructions    Diet - low sodium heart healthy    Complete by:  As directed      Increase activity slowly    Complete by:  As directed               Medication List    STOP taking these medications        folic acid 1 MG tablet  Commonly known as:  FOLVITE      TAKE these medications        digoxin 0.125 MG tablet  Commonly known as:  DIGOX  Take 1 tablet (0.125 mg total) by mouth daily.     diltiazem 240 MG 24 hr capsule  Commonly known as:  CARDIZEM CD  Take 1 capsule (240 mg total) by mouth daily.     feeding supplement (ENSURE ENLIVE) Liqd  Take 237 mLs by mouth daily at 3 pm.     hydrochlorothiazide 25 MG tablet  Commonly known as:  HYDRODIURIL  Take 0.5 tablets (12.5 mg total) by mouth daily.     levothyroxine 75 MCG tablet  Commonly known as:  SYNTHROID, LEVOTHROID  Take 1 tablet (75 mcg total) by mouth daily.     methotrexate 2.5 MG tablet  Take 20 mg by mouth once a week. Takes on Wednesday     mirtazapine 15 MG tablet  Commonly known as:  REMERON  Take 1 tablet (15 mg total) by mouth at bedtime.     predniSONE 5 MG tablet  Commonly known as:  DELTASONE  Take 5 mg by mouth daily.     Vitamin D3 2000 UNITS Tabs  Take 2,000 Units by mouth daily.     warfarin 5 MG tablet  Commonly known as:  COUMADIN  Take as directed by anticoagulation clinic       Allergies  Allergen Reactions  . Hydroxychloroquine Sulfate Itching and Rash       Follow-up Information    Follow up with [facility].   Why:  HOME HEALTH PHYSICAL THERAPY AND SOCIAL WORKER   Contact information:   [address] [place] [address] [phone]        The results of significant diagnostics from this hospitalization (including imaging, microbiology, ancillary and laboratory) are listed below for reference.    Significant Diagnostic Studies: No results found.  Microbiology: No results found for this or any previous visit (from the past 240 hour(s)).   Labs: Basic Metabolic Panel: No results for input(s): NA, K, CL, CO2, GLUCOSE, BUN, CREATININE, CALCIUM, MG, PHOS in the last 168  hours. Liver Function Tests: No results for input(s): AST, ALT, ALKPHOS, BILITOT, PROT, ALBUMIN in the last 168 hours. No results for input(s): LIPASE, AMYLASE in the last 168 hours. No results for input(s): AMMONIA in the last 168 hours. CBC: No results for input(s): WBC, NEUTROABS, HGB, HCT, MCV, PLT in the last 168 hours. Cardiac Enzymes: No results for input(s): CKTOTAL, CKMB, CKMBINDEX, TROPONINI in the last 168 hours. BNP: BNP (last 3 results)  Recent Labs  05/29/14 1610  BNP 175.7*    ProBNP (last 3 results) No results for input(s): PROBNP in the last 8760 hours.  CBG: No results for input(s): GLUCAP in the last 168 hours.     Signed:  Hollice Espy  [facility] 07/09/2014, 1:50 PM

## 2014-07-21 ENCOUNTER — Ambulatory Visit: Payer: Medicare Other

## 2014-08-18 ENCOUNTER — Telehealth: Payer: Self-pay | Admitting: Internal Medicine

## 2014-08-18 NOTE — Telephone Encounter (Signed)
Error

## 2014-08-28 ENCOUNTER — Other Ambulatory Visit: Payer: Self-pay | Admitting: Internal Medicine

## 2014-08-30 ENCOUNTER — Other Ambulatory Visit: Payer: Self-pay | Admitting: General Practice

## 2014-08-30 MED ORDER — WARFARIN SODIUM 5 MG PO TABS: ORAL_TABLET | ORAL | Status: AC

## 2014-09-14 ENCOUNTER — Ambulatory Visit: Payer: Medicare Other

## 2014-09-14 ENCOUNTER — Ambulatory Visit: Payer: Medicare Other | Admitting: Internal Medicine

## 2014-09-15 ENCOUNTER — Ambulatory Visit: Payer: Medicare Other | Admitting: Internal Medicine

## 2014-09-16 ENCOUNTER — Ambulatory Visit: Payer: Medicare Other | Admitting: Internal Medicine

## 2014-09-29 ENCOUNTER — Encounter: Payer: Self-pay | Admitting: Internal Medicine

## 2014-09-29 ENCOUNTER — Ambulatory Visit (INDEPENDENT_AMBULATORY_CARE_PROVIDER_SITE_OTHER): Payer: Medicare Other | Admitting: Internal Medicine

## 2014-09-29 ENCOUNTER — Ambulatory Visit (INDEPENDENT_AMBULATORY_CARE_PROVIDER_SITE_OTHER): Payer: Medicare Other | Admitting: General Practice

## 2014-09-29 VITALS — BP 148/52 | HR 93 | Temp 97.5°F | Ht 61.0 in | Wt 95.1 lb

## 2014-09-29 DIAGNOSIS — I10 Essential (primary) hypertension: Secondary | ICD-10-CM

## 2014-09-29 DIAGNOSIS — Z5181 Encounter for therapeutic drug level monitoring: Secondary | ICD-10-CM

## 2014-09-29 DIAGNOSIS — R7302 Impaired glucose tolerance (oral): Secondary | ICD-10-CM | POA: Diagnosis not present

## 2014-09-29 DIAGNOSIS — I4891 Unspecified atrial fibrillation: Secondary | ICD-10-CM

## 2014-09-29 DIAGNOSIS — Z0000 Encounter for general adult medical examination without abnormal findings: Secondary | ICD-10-CM

## 2014-09-29 LAB — POCT INR: INR: 2

## 2014-09-29 NOTE — Assessment & Plan Note (Signed)
stable overall by history and exam, recent data reviewed with pt, and pt to continue medical treatment as before,  to f/u any worsening symptoms or concerns Lab Results  Component Value Date   HGBA1C * 12/15/2009    6.4 (NOTE)                                                                       According to the ADA Clinical Practice Recommendations for 2011, when HbA1c is used as a screening test:   >=6.5%   Diagnostic of Diabetes Mellitus           (if abnormal result  is confirmed)  5.7-6.4%   Increased risk of developing Diabetes Mellitus  References:Diagnosis and Classification of Diabetes Mellitus,Diabetes Care,2011,34(Suppl 1):S62-S69 and Standards of Medical Care in         Diabetes - 2011,Diabetes Care,2011,34  (Suppl 1):S11-S61.    

## 2014-09-29 NOTE — Progress Notes (Signed)
Subjective:    Patient ID: [name] [name], female    DOB: Oct 27, 1918, 81 y.o.   MRN: [id number]  HPI  Here for wellness and f/u;  Overall doing ok;  Pt denies Chest pain, worsening SOB, DOE, wheezing, orthopnea, PND, worsening LE edema, palpitations, dizziness or syncope.  Pt denies neurological change such as new headache, facial or extremity weakness.  Pt denies polydipsia, polyuria, or low sugar symptoms. Pt states overall good compliance with treatment and medications, good tolerability, and has been trying to follow appropriate diet.  Pt denies worsening depressive symptoms, suicidal ideation or panic. No fever, night sweats, loss of appetite, or other constitutional symptoms.  Pt states good ability with ADL's, has low fall risk, home safety reviewed and adequate, no other significant changes in hearing or vision,   Has had some wt loss, using enlive as supplement Wt Readings from Last 3 Encounters:  09/29/14 95 lb 1.9 oz (43.146 kg)  06/17/14 101 lb (45.813 kg)  05/29/14 100 lb 12 oz (45.7 kg)  Still living alone with close daughter checks but du[place] of country [place] 3 wks sept 27; pt ok with temporary stay in assisted living. Pt with some evidence for memory dysfunction but still competant per family; pt has refused to date to move permanently to assisted living Past Medical History  Diagnosis Date  . Unspecified cerebral artery occlusion with cerebral infarction   . Recurrent cold sores   . Xanthelasma of eyelid(374.51)   . Ganglion cyst   . Anatomical narrow angle borderline glaucoma   . Hypothyroidism   . Hypertension   . Atrial fibrillation   . Sjogren's syndrome   . Goiter   . Rheumatoid arthritis(714.0)   . Osteoporosis   . Impaired glucose tolerance 11/25/2013   Past Surgical History  Procedure Laterality Date  . Appendectomy    . Vaginal cyst removed  1998     reports that she has never smoked. She does not have any smokeless tobacco history on file. She  reports that she does not drink alcohol or use illicit drugs. family history is not on file. Allergies  Allergen Reactions  . Hydroxychloroquine Sulfate Itching and Rash   Current Outpatient Prescriptions on File Prior to Visit  Medication Sig Dispense Refill  . Cholecalciferol (VITAMIN D3) 2000 UNITS TABS Take 2,000 Units by mouth daily.    . digoxin (DIGOX) 0.125 MG tablet Take 1 tablet (0.125 mg total) by mouth daily. 90 tablet 3  . diltiazem (CARDIZEM CD) 240 MG 24 hr capsule Take 1 capsule (240 mg total) by mouth daily. 90 capsule 3  . feeding supplement, ENSURE ENLIVE, (ENSURE ENLIVE) LIQD Take 237 mLs by mouth daily at 3 pm. 30 Can 12  . folic acid (FOLVITE) 1 MG tablet TAKE 1 TABLET (1 MG TOTAL) BY MOUTH DAILY. 90 tablet 2  . hydrochlorothiazide (HYDRODIURIL) 25 MG tablet Take 0.5 tablets (12.5 mg total) by mouth daily. 30 tablet 11  . levothyroxine (SYNTHROID, LEVOTHROID) 75 MCG tablet Take 1 tablet (75 mcg total) by mouth daily. 90 tablet 3  . methotrexate 2.5 MG tablet Take 20 mg by mouth once a week. Takes on Wednesday    . predniSONE (DELTASONE) 5 MG tablet Take 5 mg by mouth daily.      Marland Kitchen warfarin (COUMADIN) 5 MG tablet Take as directed by anticoagulation clinic 30 tablet 0  . mirtazapine (REMERON) 15 MG tablet Take 1 tablet (15 mg total) by mouth at bedtime. (Patient not taking:  Reported on 06/17/2014) 30 tablet 11   No current facility-administered medications on file prior to visit.   Review of Systems  Constitutional: Negative for unusual diaphoresis or night sweats HENT: Negative for ringing in ear or discharge Eyes: Negative for double vision or worsening visual disturbance.  Respiratory: Negative for choking and stridor.   Gastrointestinal: Negative for vomiting or other signifcant bowel change Genitourinary: Negative for hematuria or change in urine volume.  Musculoskeletal: Negative for other MSK pain or swelling Skin: Negative for color change and worsening wound.    Neurological: Negative for tremors and numbness other than noted  Psychiatric/Behavioral: Negative for decreased concentration or agitation other than above       Objective:   Physical Exam BP 148/52 mmHg  Pulse 93  Temp(Src) 97.5 F (36.4 C) (Oral)  Ht 5\' 1"  (1.549 m)  Wt 95 lb 1.9 oz (43.146 kg)  BMI 17.98 kg/m2  SpO2 97% VS noted,  Constitutional: Pt appears in no significant distress HENT: Head: NCAT.  Right Ear: External ear normal.  Left Ear: External ear normal.  Eyes: . Pupils are equal, round, and reactive to light. Conjunctivae and EOM are normal Neck: Normal range of motion. Neck supple.  Cardiovascular: Normal rate and regular rhythm.   Pulmonary/Chest: Effort normal and breath sounds without rales or wheezing.  Abd:  Soft, NT, ND, + BS Neurological: Pt is alert. Not confused , motor grossly intact Skin: Skin is warm. No rash, no LE edema Psychiatric: Pt behavior is normal. No agitation.     Assessment & Plan:

## 2014-09-29 NOTE — Progress Notes (Signed)
I have reviewed and agree with the plan. 

## 2014-09-29 NOTE — Progress Notes (Signed)
Pre visit review using our clinic review tool, if applicable. No additional management support is needed unless otherwise documented below in the visit note. 

## 2014-09-29 NOTE — Assessment & Plan Note (Signed)
Overall doing well, age appropriate education and counseling updated, referrals for preventative services and immunizations addressed, dietary and smoking counseling addressed, most recent labs reviewed.  I have personally reviewed and have noted:  1) the patient's medical and social history 2) The pt's use of alcohol, tobacco, and illicit drugs 3) The patient's current medications and supplements 4) Functional ability including ADL's, fall risk, home safety risk, hearing and visual impairment 5) Diet and physical activities 6) Evidence for depression or mood disorder 7) The patient's height, weight, and BMI have been recorded in the chart  I have made referrals, and provided counseling and education based on review of the above Declines further labs today Lab Results  Component Value Date   WBC 11.3* 05/30/2014   HGB 11.9* 05/30/2014   HCT 37.3 05/30/2014   PLT 310 05/30/2014   GLUCOSE 104* 05/29/2014   CHOL 200 05/27/2013   TRIG 116.0 05/27/2013   HDL 68.70 05/27/2013   LDLCALC 108* 05/27/2013   ALT 9 05/29/2014   AST 23 05/29/2014   NA 137 05/29/2014   K 3.4* 05/29/2014   CL 105 05/29/2014   CREATININE 0.74 05/29/2014   BUN 16 05/29/2014   CO2 26 05/29/2014   TSH 1.31 05/27/2013   INR 2.0 09/29/2014   HGBA1C * 12/15/2009    6.4 (NOTE)                                                                       According to the ADA Clinical Practice Recommendations for 2011, when HbA1c is used as a screening test:   >=6.5%   Diagnostic of Diabetes Mellitus           (if abnormal result  is confirmed)  5.7-6.4%   Increased risk of developing Diabetes Mellitus  References:Diagnosis and Classification of Diabetes Mellitus,Diabetes Care,2011,34(Suppl 1):S62-S69 and Standards of Medical Care in         Diabetes - 2011,Diabetes Care,2011,34  (Suppl 1):S11-S61.

## 2014-09-29 NOTE — Assessment & Plan Note (Signed)
Mild elevated, pt declines med change, o/w stable overall by history and exam, recent data reviewed with pt, and pt to continue medical treatment as before,  to f/u any worsening symptoms or concerns BP Readings from Last 3 Encounters:  09/29/14 148/52  06/17/14 138/80  05/30/14 142/47

## 2014-09-29 NOTE — Patient Instructions (Signed)
Please continue all other medications as before, and refills have been done if requested.  Please have the pharmacy call with any other refills you may need.  Please continue your efforts at being more active, low cholesterol diet, and weight control.  Please keep your appointments with your specialists as you may have planned  Please return in 6 months, or sooner if needed 

## 2014-11-06 ENCOUNTER — Other Ambulatory Visit: Payer: Self-pay | Admitting: Internal Medicine

## 2014-11-10 ENCOUNTER — Ambulatory Visit: Payer: Medicare Other

## 2014-12-02 ENCOUNTER — Encounter: Payer: Self-pay | Admitting: Internal Medicine

## 2014-12-20 ENCOUNTER — Other Ambulatory Visit: Payer: Self-pay

## 2014-12-20 ENCOUNTER — Other Ambulatory Visit: Payer: Self-pay | Admitting: Cardiology

## 2014-12-20 MED ORDER — DILTIAZEM HCL ER COATED BEADS 240 MG PO CP24: 240.0000 mg | ORAL_CAPSULE | Freq: Every day | ORAL | Status: DC

## 2014-12-26 ENCOUNTER — Other Ambulatory Visit: Payer: Self-pay | Admitting: Internal Medicine

## 2014-12-28 ENCOUNTER — Ambulatory Visit (INDEPENDENT_AMBULATORY_CARE_PROVIDER_SITE_OTHER): Payer: Medicare Other | Admitting: General Practice

## 2014-12-28 DIAGNOSIS — Z5181 Encounter for therapeutic drug level monitoring: Secondary | ICD-10-CM

## 2014-12-28 DIAGNOSIS — Z23 Encounter for immunization: Secondary | ICD-10-CM

## 2014-12-28 LAB — POCT INR: INR: 1.5

## 2014-12-28 NOTE — Progress Notes (Signed)
Pre visit review using our clinic review tool, if applicable. No additional management support is needed unless otherwise documented below in the visit note. 

## 2014-12-28 NOTE — Progress Notes (Signed)
I have reviewed and agree with the plan. 

## 2015-01-18 ENCOUNTER — Ambulatory Visit: Payer: Medicare Other

## 2015-01-25 ENCOUNTER — Ambulatory Visit: Payer: Medicare Other

## 2015-01-26 ENCOUNTER — Other Ambulatory Visit: Payer: Self-pay

## 2015-01-26 MED ORDER — DILTIAZEM HCL ER COATED BEADS 240 MG PO CP24: 240.0000 mg | ORAL_CAPSULE | Freq: Every day | ORAL | Status: DC

## 2015-03-02 ENCOUNTER — Other Ambulatory Visit: Payer: Self-pay

## 2015-03-02 ENCOUNTER — Other Ambulatory Visit: Payer: Self-pay | Admitting: Cardiology

## 2015-03-02 NOTE — Telephone Encounter (Signed)
Rx(s) sent to pharmacy electronically.  

## 2015-03-03 MED ORDER — DILTIAZEM HCL ER COATED BEADS 240 MG PO CP24: 240.0000 mg | ORAL_CAPSULE | Freq: Every day | ORAL | Status: AC

## 2015-03-03 NOTE — Telephone Encounter (Signed)
OK to refill for 3 months.  However, if she is only seeing Dr. Jonny Ruiz she will need to have him refill.  Thanks.

## 2015-03-29 ENCOUNTER — Encounter: Payer: Self-pay | Admitting: Internal Medicine

## 2015-03-29 ENCOUNTER — Ambulatory Visit (INDEPENDENT_AMBULATORY_CARE_PROVIDER_SITE_OTHER): Payer: Medicare Other | Admitting: Internal Medicine

## 2015-03-29 ENCOUNTER — Telehealth: Payer: Self-pay

## 2015-03-29 ENCOUNTER — Ambulatory Visit (INDEPENDENT_AMBULATORY_CARE_PROVIDER_SITE_OTHER): Payer: Medicare Other | Admitting: General Practice

## 2015-03-29 ENCOUNTER — Telehealth: Payer: Self-pay | Admitting: Internal Medicine

## 2015-03-29 VITALS — BP 136/82 | HR 57 | Temp 98.6°F | Resp 20 | Ht 61.0 in | Wt 96.5 lb

## 2015-03-29 DIAGNOSIS — I10 Essential (primary) hypertension: Secondary | ICD-10-CM

## 2015-03-29 DIAGNOSIS — E039 Hypothyroidism, unspecified: Secondary | ICD-10-CM

## 2015-03-29 DIAGNOSIS — F0390 Unspecified dementia without behavioral disturbance: Secondary | ICD-10-CM | POA: Insufficient documentation

## 2015-03-29 DIAGNOSIS — I4891 Unspecified atrial fibrillation: Secondary | ICD-10-CM | POA: Diagnosis not present

## 2015-03-29 DIAGNOSIS — R7302 Impaired glucose tolerance (oral): Secondary | ICD-10-CM

## 2015-03-29 DIAGNOSIS — Z5181 Encounter for therapeutic drug level monitoring: Secondary | ICD-10-CM

## 2015-03-29 LAB — POCT INR: INR: 1.1

## 2015-03-29 NOTE — Patient Instructions (Signed)
You are given the prescription for the wheelchair  Please continue all other medications as before, and refills have been done if requested.  Please have the pharmacy call with any other refills you may need.  Please continue your efforts at being more active, low cholesterol diet, and weight control.  You are otherwise up to date with prevention measures today.  Please keep your appointments with your specialists as you may have planned  Please go to the LAB in the Basement (turn left off the elevator) for the tests to be done IN 4 Weeks  You will be contacted by phone if any changes need to be made immediately.  Otherwise, you will receive a letter about your results with an explanation, but please check with MyChart first.  Please remember to sign up for MyChart if you have not done so, as this will be important to you in the future with finding out test results, communicating by private email, and scheduling acute appointments online when needed.  Please return in 6 months, or sooner if needed

## 2015-03-29 NOTE — Progress Notes (Signed)
I have reviewed and agree with the plan. 

## 2015-03-29 NOTE — Telephone Encounter (Signed)
Please advise 

## 2015-03-29 NOTE — Telephone Encounter (Signed)
Need letter to go to [employer], stating that she is not capable of making legal judgements.  States Amalia Hailey has information on this.  Letter has to be written a certain way.  Please follow up with patient.

## 2015-03-29 NOTE — Progress Notes (Signed)
Pre visit review using our clinic review tool, if applicable. No additional management support is needed unless otherwise documented below in the visit note. Patient is low today.  Daughter says that patient has not been taking medication as prescribed but that she will do all she can to help her out.  Encouraged pt and daughter to purchase a pill box.

## 2015-03-29 NOTE — Progress Notes (Signed)
Subjective:    Patient ID: [name] [name], female    DOB: 1919/02/13, 82 y.o.   MRN: [id number]  HPI    Here for wellness and f/u;  Overall doing ok;  Pt denies Chest pain, worsening SOB, DOE, wheezing, orthopnea, PND, worsening LE edema, palpitations, dizziness or syncope.  Pt denies neurological change such as new headache, facial or extremity weakness.  Pt denies polydipsia, polyuria, or low sugar symptoms. Pt states overall good compliance with treatment and medications, good tolerability, and has been trying to follow appropriate diet.  Pt denies worsening depressive symptoms, suicidal ideation or panic. No fever, night sweats, wt loss, loss of appetite, or other constitutional symptoms.  Pt states good ability with ADL's, has low fall risk, home safety reviewed and adequate, no other significant changes in hearing or vision, and only occasionally active with exercise.  Pt was set to go to [facility] assisted living but refused at the last moment, quite stressful for the daughter.  Needs wheelchair for safety at home. Past Medical History  Diagnosis Date  . Unspecified cerebral artery occlusion with cerebral infarction   . Recurrent cold sores   . Xanthelasma of eyelid(374.51)   . Ganglion cyst   . Anatomical narrow angle borderline glaucoma   . Hypothyroidism   . Hypertension   . Atrial fibrillation (HCC)   . Sjogren's syndrome (HCC)   . Goiter   . Rheumatoid arthritis(714.0)   . Osteoporosis   . Impaired glucose tolerance 11/25/2013   Past Surgical History  Procedure Laterality Date  . Appendectomy    . Vaginal cyst removed  1998     reports that she has never smoked. She does not have any smokeless tobacco history on file. She reports that she does not drink alcohol or use illicit drugs. family history is not on file. Allergies  Allergen Reactions  . Hydroxychloroquine Sulfate Itching and Rash   Current Outpatient Prescriptions on File Prior to Visit  Medication Sig  Dispense Refill  . Cholecalciferol (VITAMIN D3) 2000 UNITS TABS Take 2,000 Units by mouth daily.    . digoxin (LANOXIN) 0.125 MG tablet Take 1 tablet (125 mcg total) by mouth daily. PATIENT NEEDS TO CONTACT OFFICE FOR ADDITIONAL REFILLS 90 tablet 0  . diltiazem (CARDIZEM CD) 240 MG 24 hr capsule Take 1 capsule (240 mg total) by mouth daily. 30 capsule 0  . feeding supplement, ENSURE ENLIVE, (ENSURE ENLIVE) LIQD Take 237 mLs by mouth daily at 3 pm. 30 Can 12  . folic acid (FOLVITE) 1 MG tablet TAKE 1 TABLET (1 MG TOTAL) BY MOUTH DAILY. 90 tablet 2  . hydrochlorothiazide (HYDRODIURIL) 25 MG tablet TAKE 1/2 TABLET BY MOUTH EVERY DAY 30 tablet 5  . levothyroxine (SYNTHROID, LEVOTHROID) 75 MCG tablet Take 1 tablet (75 mcg total) by mouth daily. 90 tablet 3  . methotrexate 2.5 MG tablet Take 20 mg by mouth once a week. Takes on Wednesday    . predniSONE (DELTASONE) 5 MG tablet Take 5 mg by mouth daily.      Marland Kitchen warfarin (COUMADIN) 5 MG tablet Take as directed by anticoagulation clinic 30 tablet 0   No current facility-administered medications on file prior to visit.      Review of Systems - limited due to dementia  Constitutional: Negative for unusual diaphoresis or night sweats HENT: Negative for ringing in ear or discharge Eyes: Negative for double vision or worsening visual disturbance.  Respiratory: Negative for choking and stridor.   Gastrointestinal: Negative for  vomiting or other signifcant bowel change Genitourinary: Negative for hematuria or change in urine volume.  Musculoskeletal: Negative for other MSK pain or swelling Skin: Negative for color change and worsening wound.  Neurological: Negative for tremors and numbness other than noted  Psychiatric/Behavioral: Negative for decreased concentration or agitation other than above       Objective:   Physical Exam BP 136/82 mmHg  Pulse 57  Temp(Src) 98.6 F (37 C) (Oral)  Resp 20  Ht 5\' 1"  (1.549 m)  Wt 96 lb 8 oz (43.772 kg)  BMI  18.24 kg/m2  SpO2 98% VS noted,  Constitutional: Pt appears in no significant distress HENT: Head: NCAT.  Right Ear: External ear normal.  Left Ear: External ear normal.  Eyes: . Pupils are equal, round, and reactive to light. Conjunctivae and EOM are normal Neck: Normal range of motion. Neck supple.  Cardiovascular: Normal rate and regular rhythm.   Pulmonary/Chest: Effort normal and breath sounds without rales or wheezing.  Abd:  Soft, NT, ND, + BS Neurological: Pt is alert. + confused , motor grossly intact , severe HOH Skin: Skin is warm. No rash, no LE edema Psychiatric: Pt behavior is normal. No agitation.     Assessment & Plan:

## 2015-03-29 NOTE — Progress Notes (Signed)
Pre visit review using our clinic review tool, if applicable. No additional management support is needed unless otherwise documented below in the visit note. 

## 2015-03-29 NOTE — Telephone Encounter (Signed)
The patients daughter is trying to get a HCPOA done. About a month ago, she came in and advised that her attorney needs a letter from [place] stating that the patient is not capable of decision making. We had not seen the patient since July at that point, so we scheduled the appointment today. shes looking for that letter

## 2015-03-29 NOTE — Telephone Encounter (Signed)
A user error has taken place.

## 2015-03-30 NOTE — Telephone Encounter (Signed)
Letter Done hardcopy to [employer]

## 2015-04-05 NOTE — Assessment & Plan Note (Signed)
stable overall by history and exam, recent data reviewed with pt, and pt to continue medical treatment as before,  to f/u any worsening symptoms or concerns Lab Results  Component Value Date   TSH 1.31 05/27/2013

## 2015-04-05 NOTE — Assessment & Plan Note (Signed)
stable overall by history and exam, recent data reviewed with pt, and pt to continue medical treatment as before,  to f/u any worsening symptoms or concerns Lab Results  Component Value Date   HGBA1C * 12/15/2009    6.4 (NOTE)                                                                       According to the ADA Clinical Practice Recommendations for 2011, when HbA1c is used as a screening test:   >=6.5%   Diagnostic of Diabetes Mellitus           (if abnormal result  is confirmed)  5.7-6.4%   Increased risk of developing Diabetes Mellitus  References:Diagnosis and Classification of Diabetes Mellitus,Diabetes Care,2011,34(Suppl 1):S62-S69 and Standards of Medical Care in         Diabetes - 2011,Diabetes Care,2011,34  (Suppl 1):S11-S61.

## 2015-04-05 NOTE — Assessment & Plan Note (Signed)
stable overall by history and exam, recent data reviewed with pt and family, and pt to continue medical treatment as before,  to f/u any worsening symptoms or concerns  

## 2015-04-05 NOTE — Assessment & Plan Note (Signed)
stable overall by history and exam, recent data reviewed with pt, and pt to continue medical treatment as before,  to f/u any worsening symptoms or concerns BP Readings from Last 3 Encounters:  03/29/15 136/82  09/29/14 148/52  06/17/14 138/80

## 2015-04-08 ENCOUNTER — Ambulatory Visit: Payer: Medicare Other

## 2015-04-18 ENCOUNTER — Encounter: Payer: Self-pay | Admitting: Internal Medicine

## 2015-04-30 ENCOUNTER — Other Ambulatory Visit: Payer: Self-pay | Admitting: Cardiology

## 2015-05-02 NOTE — Telephone Encounter (Signed)
Rx refused, deferred to PCP.

## 2015-06-15 ENCOUNTER — Encounter: Payer: Self-pay | Admitting: Internal Medicine

## 2015-06-17 ENCOUNTER — Telehealth: Payer: Self-pay | Admitting: Internal Medicine

## 2015-06-17 NOTE — Telephone Encounter (Signed)
Went to see patient today.  Patient has declined.  She can not swallow.  Therefore can not take medication.  Would like Dr. Jonny Ruiz to review medication list and call back with verbal order for discontinue of which medications.

## 2015-06-21 NOTE — Telephone Encounter (Signed)
Pt should take all meds if possible, as there is non that is non essential based on her last exam  Please consider OV, or I can refer to Hospice   Pt should go to ER as well if unable to take fluids and food

## 2015-06-21 NOTE — Telephone Encounter (Signed)
Not sure if someone has already sent this to you, if they have I can not see it. Please advise as to what patient should do

## 2015-07-07 ENCOUNTER — Telehealth: Payer: Self-pay | Admitting: Internal Medicine

## 2015-07-07 NOTE — Telephone Encounter (Signed)
Caller name: [place]   Relation to pt: Home Health RN from [facility]  Call back number: [phone]   Reason for call:  Requesting verbal orders for 5 night respite at [facility]. Please advise

## 2015-07-26 ENCOUNTER — Telehealth: Payer: Self-pay | Admitting: Internal Medicine

## 2015-07-26 NOTE — Telephone Encounter (Signed)
FYI: Patient is not taking any medication.  She is only taking Tylenol.

## 2015-08-12 ENCOUNTER — Ambulatory Visit: Payer: Self-pay | Admitting: General Practice

## 2015-08-12 DIAGNOSIS — Z5181 Encounter for therapeutic drug level monitoring: Secondary | ICD-10-CM

## 2015-09-07 ENCOUNTER — Telehealth: Payer: Self-pay

## 2015-09-07 NOTE — Telephone Encounter (Signed)
On 09/07/2015 I received a death certificate from [employer] (original). The death certificate is for cremation. The patient is a patient of Doctor Oliver Barre. The death certificate will be taken to [facility] this pm for signature. On 2015-09-27 I received the death certificate back from Doctor Oliver Barre. I got the death certificate ready and called the funeral home to let them know the death certificate is ready for pickup. I also faxed the funeral home a copy per their request.

## 2015-09-10 DEATH — deceased

## 2017-01-15 IMAGING — CT CT HEAD W/O CM
2 series · 15 of 30 positions shown, 19 images · non-contrast
Comparison: Brain MRI and head CT, 12/15/2009

CLINICAL DATA: 87yof, fell onto the floor tile in the bathroom this
am. Bruising to the rt superior orbital area. unkown loc. Hx of
A-fib, HTN, collagen vascular dz, Unspecified cerebral artery
occlusion with cerebral infarction; Recurrent cold sores;
Xanthelasma of eyelid(374.51); Ganglion cyst; Anatomical narrow
angle borderline glaucoma; Hypothyroidism; Atrial fibrillation;
Goiter; Rheumatoid arthritis(714.0); Osteoporosis; Impaired glucose
tolerance

EXAM:
CT HEAD WITHOUT CONTRAST
TECHNIQUE: Contiguous axial images were obtained from the base of the skull
through the vertex without intravenous contrast.

[Series 301: head w/o, idose (1) · axial · non-contrast · 0.49mm/px · z∈[+86,+216]mm · 13 of 32 slices shown, 17 images]
[im 3/32  brain]
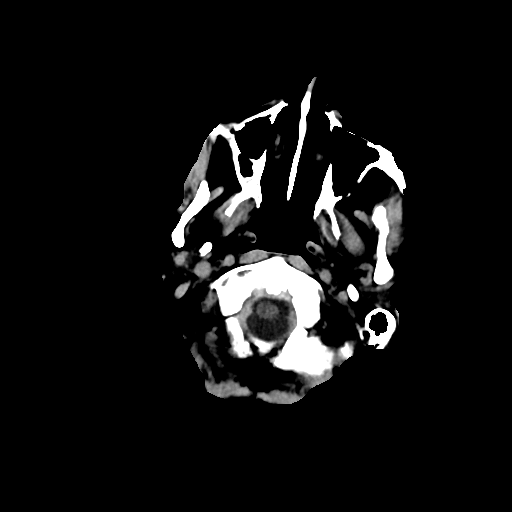
[im 3/32  bone]
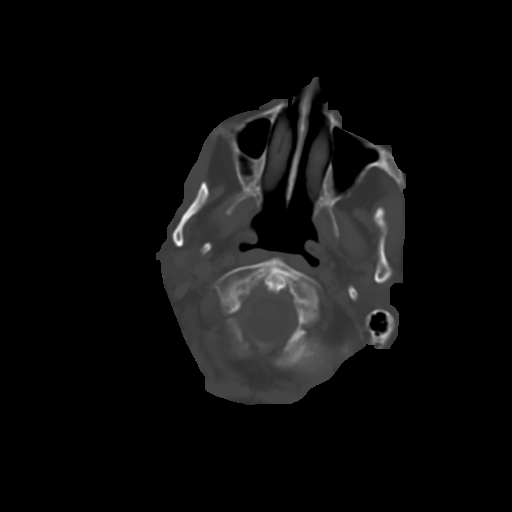
[im 5/32  brain]
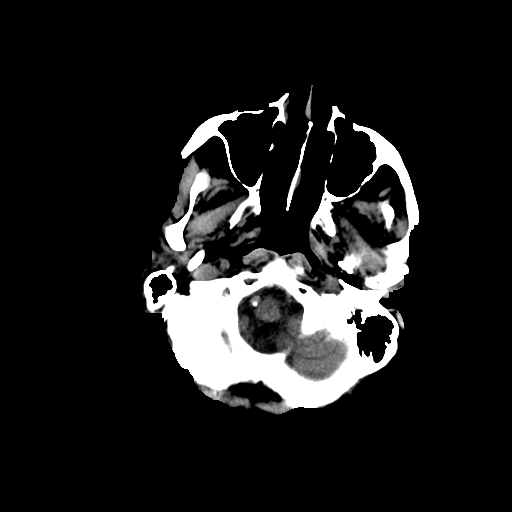
[im 7/32  brain]
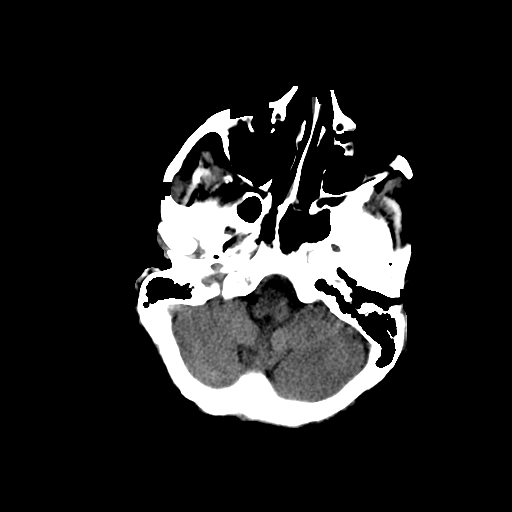
[im 9/32  brain]
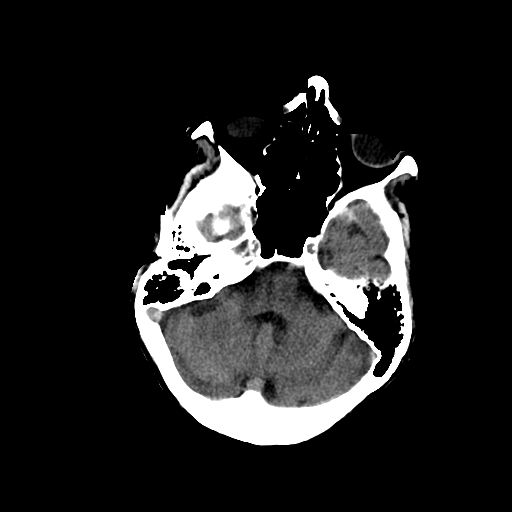
[im 12/32  brain]
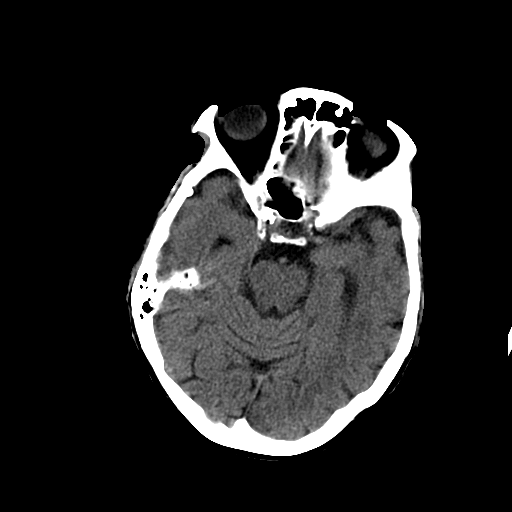
[im 12/32  bone]
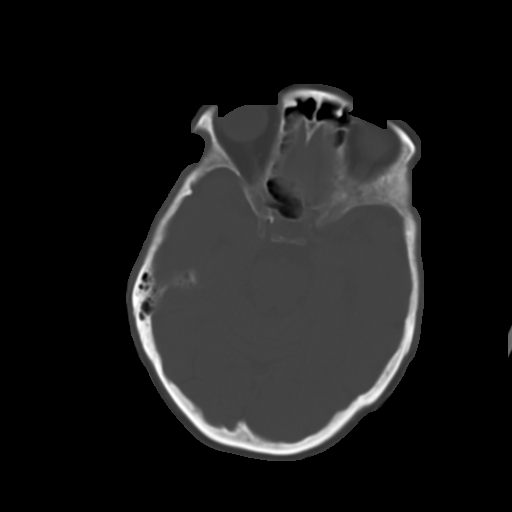
[im 14/32  brain]
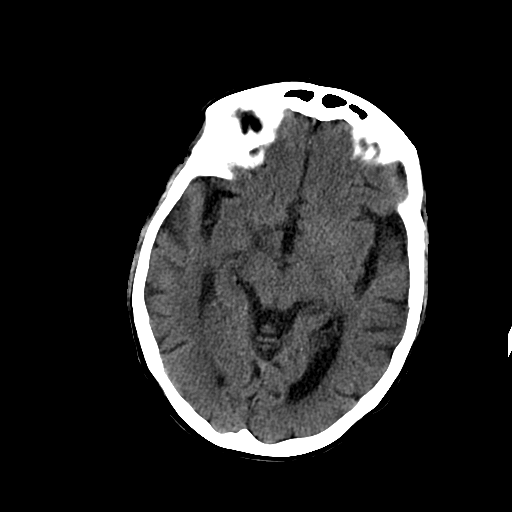
[im 16/32  brain]
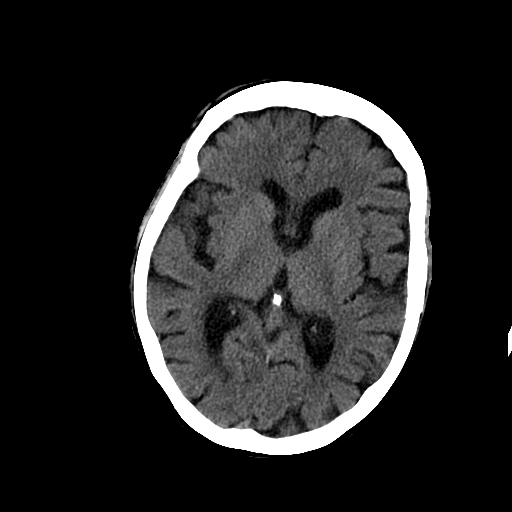
[im 18/32  brain]
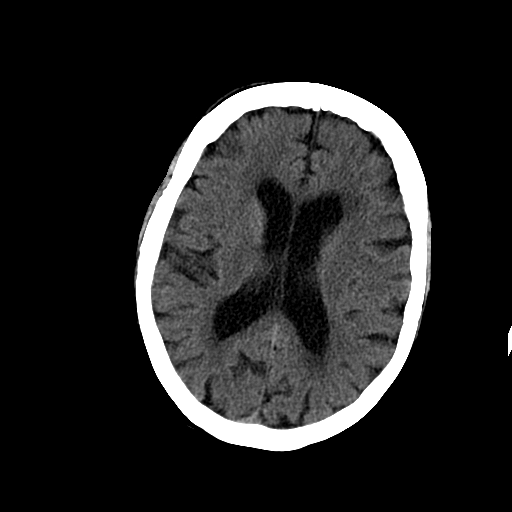
[im 20/32  brain]
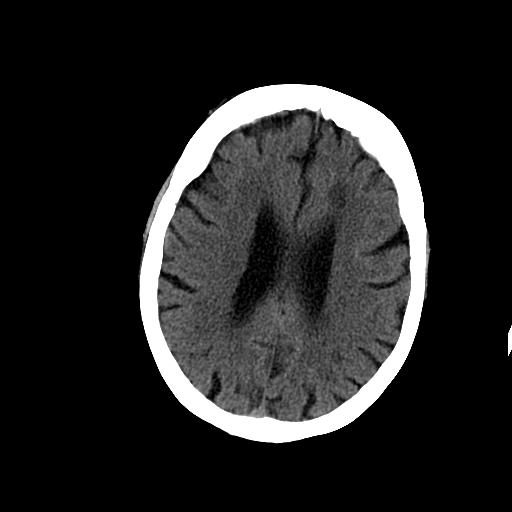
[im 20/32  bone]
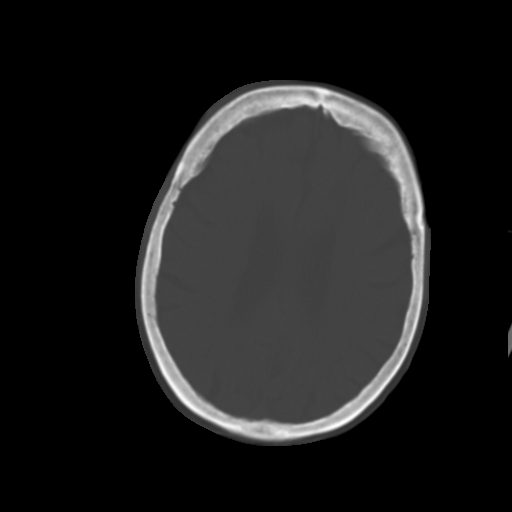
[im 23/32  brain]
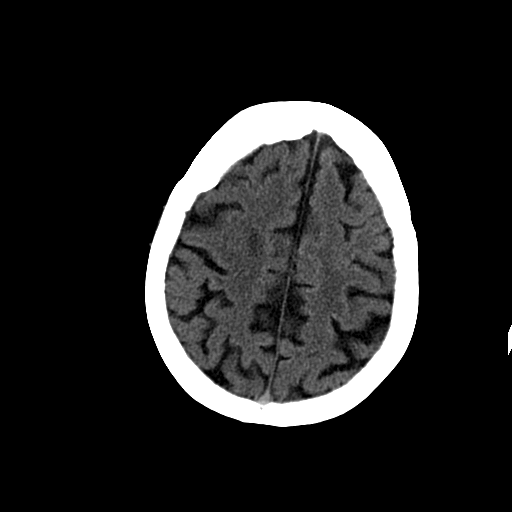
[im 25/32  brain]
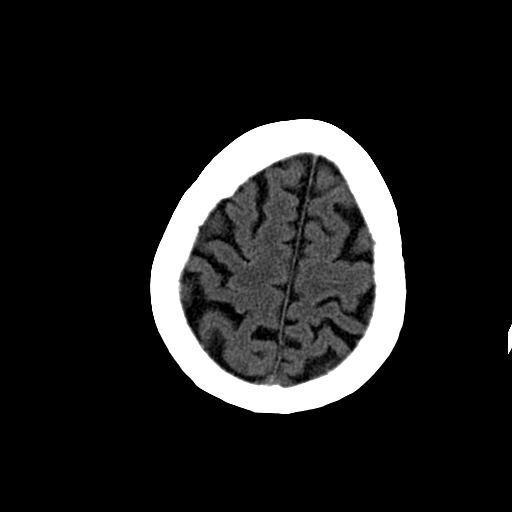
[im 27/32  brain]
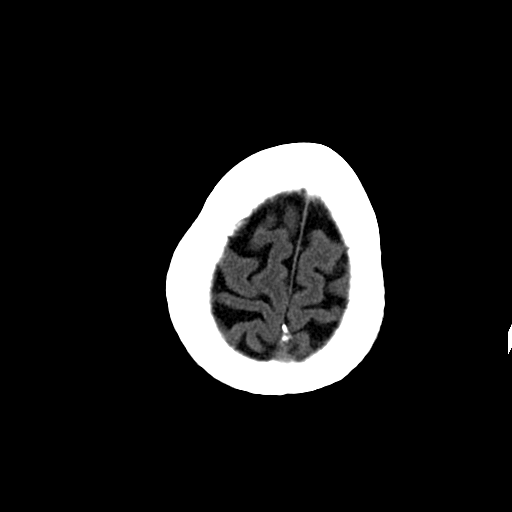
[im 29/32  brain]
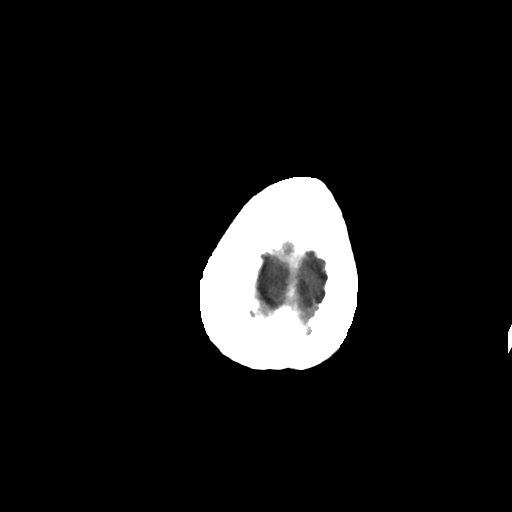
[im 29/32  bone]
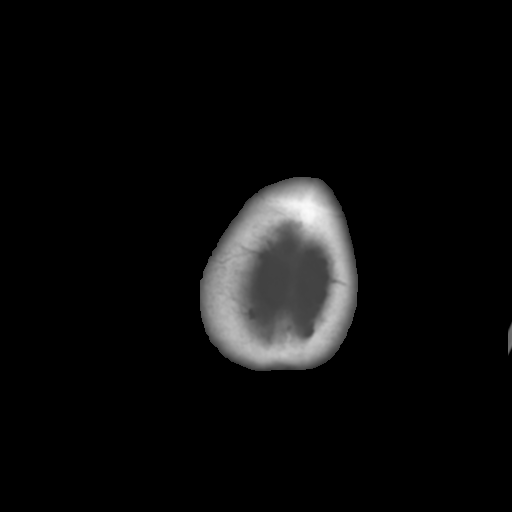

[Series 302: head w/o bone, idose (1) · axial · non-contrast · 0.49mm/px · z∈[+86,+106]mm · 2 of 32 slices shown]
[im 3/32  bone]
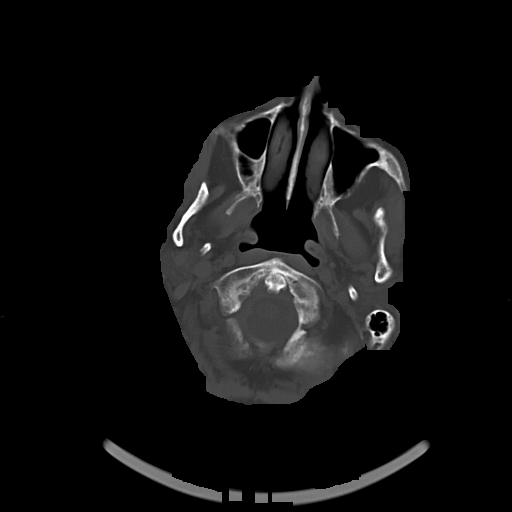
[im 7/32  bone]
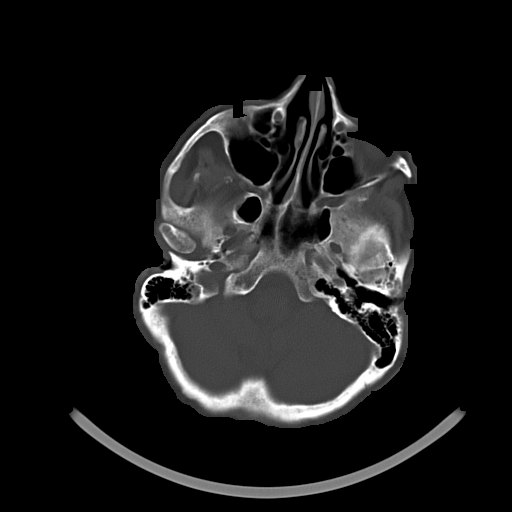

[15 of 30 positions shown; findings below may reference images not displayed]

FINDINGS: Ventricles are normal in configuration. There is ventricular and
sulcal enlargement reflecting age related atrophy. No hydrocephalus.

No parenchymal masses or mass effect. Patchy areas of white matter
hypoattenuation noted consistent with mild to moderate chronic
microvascular ischemic change, stable. There is no evidence of a
cortical infarct.

There are no extra-axial masses or abnormal fluid collections.

No intracranial hemorrhage.

Sinuses and mastoid air cells are clear.  No skull lesion.
IMPRESSION: 1. No acute findings.
2. Age related atrophy. Mild to moderate chronic microvascular
ischemic change.
3. Stable appearance from the prior head CT.
